# Patient Record
Sex: Male | Born: 1970 | Race: Black or African American | Hispanic: No | Marital: Married | Smoking: Never smoker
Health system: Southern US, Community
[De-identification: ages and names within clinical notes are randomized; demographics above are authoritative.]

## PROBLEM LIST (undated history)

## (undated) DIAGNOSIS — E785 Hyperlipidemia, unspecified: Secondary | ICD-10-CM

## (undated) DIAGNOSIS — E119 Type 2 diabetes mellitus without complications: Secondary | ICD-10-CM

## (undated) DIAGNOSIS — I10 Essential (primary) hypertension: Secondary | ICD-10-CM

## (undated) DIAGNOSIS — G8929 Other chronic pain: Secondary | ICD-10-CM

## (undated) DIAGNOSIS — K219 Gastro-esophageal reflux disease without esophagitis: Secondary | ICD-10-CM

## (undated) DIAGNOSIS — M549 Dorsalgia, unspecified: Secondary | ICD-10-CM

## (undated) HISTORY — DX: Hyperlipidemia, unspecified: E78.5

## (undated) HISTORY — PX: TONSILLECTOMY AND ADENOIDECTOMY: SHX28

## (undated) HISTORY — DX: Dorsalgia, unspecified: M54.9

## (undated) HISTORY — DX: Essential (primary) hypertension: I10

## (undated) HISTORY — DX: Other chronic pain: G89.29

## (undated) HISTORY — DX: Type 2 diabetes mellitus without complications: E11.9

---

## 2001-11-24 ENCOUNTER — Encounter: Payer: Self-pay | Admitting: Family Medicine

## 2001-11-24 ENCOUNTER — Encounter: Admission: RE | Admit: 2001-11-24 | Discharge: 2001-11-24 | Payer: Self-pay | Admitting: Family Medicine

## 2001-11-29 ENCOUNTER — Encounter: Payer: Self-pay | Admitting: Family Medicine

## 2001-11-29 ENCOUNTER — Encounter: Admission: RE | Admit: 2001-11-29 | Discharge: 2001-11-29 | Payer: Self-pay | Admitting: Family Medicine

## 2006-04-06 ENCOUNTER — Emergency Department (HOSPITAL_COMMUNITY): Admission: EM | Admit: 2006-04-06 | Discharge: 2006-04-06 | Payer: Self-pay | Admitting: Family Medicine

## 2006-04-19 ENCOUNTER — Ambulatory Visit: Payer: Self-pay | Admitting: Family Medicine

## 2006-04-19 LAB — CONVERTED CEMR LAB
ALT: 36 units/L (ref 0–40)
AST: 29 units/L (ref 0–37)
BUN: 7 mg/dL (ref 6–23)
CO2: 32 meq/L (ref 19–32)
Calcium: 9.1 mg/dL (ref 8.4–10.5)
Chloride: 105 meq/L (ref 96–112)
Chol/HDL Ratio, serum: 4.8
Cholesterol: 204 mg/dL (ref 0–200)
Creatinine, Ser: 1 mg/dL (ref 0.4–1.5)
GFR calc non Af Amer: 90 mL/min
Glomerular Filtration Rate, Af Am: 109 mL/min/{1.73_m2}
Glucose, Bld: 115 mg/dL — ABNORMAL HIGH (ref 70–99)
HDL: 42.5 mg/dL (ref >39.0)
LDL DIRECT: 141.2 mg/dL
Potassium: 3.4 meq/L — ABNORMAL LOW (ref 3.5–5.1)
Sodium: 142 meq/L (ref 135–145)
Triglyceride fasting, serum: 147 mg/dL (ref 0–149)
VLDL: 29 mg/dL (ref 0–40)

## 2006-05-14 ENCOUNTER — Ambulatory Visit: Payer: Self-pay | Admitting: Family Medicine

## 2006-05-14 LAB — CONVERTED CEMR LAB: Potassium: 3.8 meq/L (ref 3.5–5.1)

## 2006-08-27 ENCOUNTER — Ambulatory Visit: Payer: Self-pay | Admitting: Family Medicine

## 2006-09-17 ENCOUNTER — Ambulatory Visit: Payer: Self-pay | Admitting: Internal Medicine

## 2007-04-12 ENCOUNTER — Ambulatory Visit: Payer: Self-pay | Admitting: Family Medicine

## 2007-04-12 DIAGNOSIS — I10 Essential (primary) hypertension: Secondary | ICD-10-CM | POA: Insufficient documentation

## 2007-04-12 LAB — CONVERTED CEMR LAB
BUN: 9 mg/dL (ref 6–23)
CO2: 29 meq/L (ref 19–32)
Calcium: 9.3 mg/dL (ref 8.4–10.5)
Chloride: 102 meq/L (ref 96–112)
Creatinine, Ser: 1.1 mg/dL (ref 0.4–1.5)
GFR calc Af Amer: 97 mL/min
GFR calc non Af Amer: 81 mL/min
Glucose, Bld: 111 mg/dL — ABNORMAL HIGH (ref 70–99)
Potassium: 3.5 meq/L (ref 3.5–5.1)
Rapid Strep: NEGATIVE
Sodium: 140 meq/L (ref 135–145)

## 2007-04-13 ENCOUNTER — Encounter (INDEPENDENT_AMBULATORY_CARE_PROVIDER_SITE_OTHER): Payer: Self-pay | Admitting: *Deleted

## 2007-04-13 ENCOUNTER — Telehealth (INDEPENDENT_AMBULATORY_CARE_PROVIDER_SITE_OTHER): Payer: Self-pay | Admitting: *Deleted

## 2007-08-29 ENCOUNTER — Telehealth (INDEPENDENT_AMBULATORY_CARE_PROVIDER_SITE_OTHER): Payer: Self-pay | Admitting: *Deleted

## 2007-08-29 ENCOUNTER — Other Ambulatory Visit: Payer: Self-pay

## 2007-08-29 ENCOUNTER — Emergency Department: Payer: Self-pay | Admitting: Emergency Medicine

## 2008-05-03 ENCOUNTER — Ambulatory Visit: Payer: Self-pay | Admitting: Family Medicine

## 2008-05-04 LAB — CONVERTED CEMR LAB

## 2008-05-15 ENCOUNTER — Ambulatory Visit: Payer: Self-pay | Admitting: Family Medicine

## 2008-05-16 LAB — CONVERTED CEMR LAB: Mono Screen: NEGATIVE

## 2009-02-11 ENCOUNTER — Ambulatory Visit: Payer: Self-pay | Admitting: Family Medicine

## 2009-03-04 ENCOUNTER — Telehealth: Payer: Self-pay | Admitting: Family Medicine

## 2009-04-01 ENCOUNTER — Ambulatory Visit: Payer: Self-pay | Admitting: Family Medicine

## 2009-04-01 DIAGNOSIS — I10 Essential (primary) hypertension: Secondary | ICD-10-CM | POA: Insufficient documentation

## 2009-04-01 DIAGNOSIS — K219 Gastro-esophageal reflux disease without esophagitis: Secondary | ICD-10-CM | POA: Insufficient documentation

## 2009-04-01 DIAGNOSIS — M545 Low back pain, unspecified: Secondary | ICD-10-CM | POA: Insufficient documentation

## 2009-04-11 ENCOUNTER — Encounter: Payer: Self-pay | Admitting: Family Medicine

## 2009-04-13 ENCOUNTER — Encounter: Payer: Self-pay | Admitting: Family Medicine

## 2009-05-06 ENCOUNTER — Telehealth: Payer: Self-pay | Admitting: Family Medicine

## 2009-05-15 ENCOUNTER — Ambulatory Visit: Payer: Self-pay | Admitting: Family Medicine

## 2009-05-15 DIAGNOSIS — E119 Type 2 diabetes mellitus without complications: Secondary | ICD-10-CM | POA: Insufficient documentation

## 2009-05-15 DIAGNOSIS — E1165 Type 2 diabetes mellitus with hyperglycemia: Secondary | ICD-10-CM | POA: Insufficient documentation

## 2009-05-15 DIAGNOSIS — E118 Type 2 diabetes mellitus with unspecified complications: Secondary | ICD-10-CM | POA: Insufficient documentation

## 2009-05-16 ENCOUNTER — Ambulatory Visit: Payer: Self-pay | Admitting: Family Medicine

## 2009-05-16 LAB — CONVERTED CEMR LAB
ALT: 20 units/L (ref 0–53)
Albumin: 3.8 g/dL (ref 3.5–5.2)
Basophils Relative: 0.8 % (ref 0.0–3.0)
Bilirubin, Direct: 0.1 mg/dL (ref 0.0–0.3)
CO2: 29 meq/L (ref 19–32)
Chloride: 103 meq/L (ref 96–112)
Eosinophils Absolute: 0.1 10*3/uL (ref 0.0–0.7)
Eosinophils Relative: 1.8 % (ref 0.0–5.0)
HCT: 40.6 % (ref 39.0–52.0)
Hemoglobin: 13.4 g/dL (ref 13.0–17.0)
LDL Cholesterol: 109 mg/dL — ABNORMAL HIGH (ref 0–99)
Lymphs Abs: 2.1 10*3/uL (ref 0.7–4.0)
MCHC: 32.9 g/dL (ref 30.0–36.0)
MCV: 88.3 fL (ref 78.0–100.0)
Monocytes Absolute: 0.4 10*3/uL (ref 0.1–1.0)
Neutro Abs: 2.9 10*3/uL (ref 1.4–7.7)
Neutrophils Relative %: 50.7 % (ref 43.0–77.0)
Potassium: 4 meq/L (ref 3.5–5.1)
RBC: 4.59 M/uL (ref 4.22–5.81)
Sodium: 140 meq/L (ref 135–145)
Total CHOL/HDL Ratio: 4
Total Protein: 7.1 g/dL (ref 6.0–8.3)
Triglycerides: 82 mg/dL (ref 0.0–149.0)
WBC: 5.5 10*3/uL (ref 4.5–10.5)

## 2009-05-20 LAB — CONVERTED CEMR LAB
Calcium: 9.2 mg/dL (ref 8.4–10.5)
Creatinine,U: 156.7 mg/dL
GFR calc non Af Amer: 107.02 mL/min (ref >60)
Glucose, Bld: 298 mg/dL — ABNORMAL HIGH (ref 70–99)
Hgb A1c MFr Bld: 11.6 % — ABNORMAL HIGH (ref 4.6–6.5)
Sodium: 137 meq/L (ref 135–145)

## 2009-05-30 ENCOUNTER — Encounter: Payer: Self-pay | Admitting: Family Medicine

## 2009-06-18 ENCOUNTER — Encounter: Payer: Self-pay | Admitting: Family Medicine

## 2009-06-18 ENCOUNTER — Ambulatory Visit: Payer: Self-pay | Admitting: Family Medicine

## 2009-06-20 ENCOUNTER — Ambulatory Visit: Payer: Self-pay | Admitting: Family Medicine

## 2009-06-21 DIAGNOSIS — E785 Hyperlipidemia, unspecified: Secondary | ICD-10-CM | POA: Insufficient documentation

## 2009-07-24 ENCOUNTER — Telehealth: Payer: Self-pay | Admitting: Family Medicine

## 2009-08-06 ENCOUNTER — Encounter: Payer: Self-pay | Admitting: Family Medicine

## 2009-08-12 ENCOUNTER — Encounter: Payer: Self-pay | Admitting: Family Medicine

## 2009-10-17 ENCOUNTER — Ambulatory Visit: Payer: Self-pay | Admitting: Family Medicine

## 2009-10-18 ENCOUNTER — Encounter (INDEPENDENT_AMBULATORY_CARE_PROVIDER_SITE_OTHER): Payer: Self-pay | Admitting: *Deleted

## 2010-02-24 ENCOUNTER — Ambulatory Visit: Payer: Self-pay | Admitting: Family Medicine

## 2010-06-24 NOTE — Consult Note (Signed)
Summary: [facility]  [facility]   Imported By: Lanelle Bal 06/26/2009 13:46:59  _____________________________________________________________________  External Attachment:    Type:   Image     Comment:   External Document

## 2010-06-24 NOTE — Letter (Signed)
Summary: [facility]   [facility], [facility]   Imported By: Maryln Gottron 08/12/2009 14:00:10  _____________________________________________________________________  External Attachment:    Type:   Image     Comment:   External Document

## 2010-06-24 NOTE — Letter (Signed)
Summary: Letter Regarding Eye Exam/[employer]  Letter Regarding Eye Exam/[employer]   Imported By: Lanelle Bal 08/23/2009 08:44:00  _____________________________________________________________________  External Attachment:    Type:   Image     Comment:   External Document

## 2010-06-24 NOTE — Miscellaneous (Signed)
Summary: PT Discharge/[facility]  PT Discharge/[facility]   Imported By: Lanelle Bal 06/22/2009 10:58:58  _____________________________________________________________________  External Attachment:    Type:   Image     Comment:   External Document

## 2010-06-24 NOTE — Letter (Signed)
Summary: Generic Letter  [facility]  [address] [place], [place] [address]   Phone: [phone]  Fax: [phone]    10/18/2009     Phillip Norman [address] [place], [place]  [address]    Dear Mr. Albus,     Mr. Rabbani, I wanted to send you a follow-up letter. Your a1c average sugar test was 6.6.  You have done amazingly well, and I know you have been working really hard.  6.5 is normal.   This is great.  Take care.         Sincerely,   Hannah Beat MD

## 2010-06-24 NOTE — Assessment & Plan Note (Signed)
Summary: FLU SHOT, PER HEATHER/JRR   Allergies: No Known Drug Allergies   Complete Medication List: 1)  Lisinopril 10 Mg Tabs (Lisinopril) .... Once daily  Other Orders: Flu Vaccine 22yrs + ([address]) Admin 1st Vaccine (60454)   Immunizations Administered:  Influenza Vaccine # 1:    Vaccine Type: Fluvax 3+    Site: left deltoid    Mfr: GlaxoSmithKline    Dose: 0.5 ml    Route: IM    Given by: Benny Lennert CMA (AAMA)    Exp. Date: 11/22/2010    Lot #: [id number]    VIS given: 12/17/09 version given February 24, 2010.  Flu Vaccine Consent Questions:    Do you have a history of severe allergic reactions to this vaccine? no    Any prior history of allergic reactions to egg and/or gelatin? no    Do you have a sensitivity to the preservative Thimersol? no    Do you have a past history of Guillan-Barre Syndrome? no    Do you currently have an acute febrile illness? no    Have you ever had a severe reaction to latex? no    Vaccine information given and explained to patient? yes

## 2010-06-24 NOTE — Assessment & Plan Note (Signed)
Summary: F/U/CLE   Vital Signs:  Patient profile:   40 year old male Height:      74.25 inches Weight:      210.0 pounds BMI:     26.88 Temp:     97.9 degrees F oral Pulse rate:   76 / minute Pulse rhythm:   regular BP sitting:   130 / 92  (left arm) Cuff size:   large  Vitals Entered By: Benny Lennert CMA Duncan Dull) (Oct 17, 2009 12:16 PM)  History of Present Illness: Chief complaint follow up  DM:  discontinued his meds  HTN: not at goal, not taking any medications.  f/u labs needed  low carbs and really has cut back has his treadmill at home has a [employer] cd with a ball exercising for about three to five times a week  has stopped his bs 97-101  110 after supper  this patient has lost almost 30 pounds.  ROS: GEN: No acute illnesses, no fevers, chills, sweats, fatigue, weight loss, or URI sx. GI: No n/v/d Pulm: No SOB, cough, wheezing Interactive and getting along well at home.  Otherwise, ROS is as per the HPI.   GEN: WDWN, NAD, Non-toxic, A & O x 3 HEENT: Atraumatic, Normocephalic. Neck supple. No masses, No LAD. Ears and Nose: No external deformity. CV: RRR, No M/G/R. No JVD. No thrill. No extra heart sounds. PULM: CTA B, no wheezes, crackles, rhonchi. No retractions. No resp. distress. No accessory muscle use. EXTR: No c/c/e NEURO: Normal gait.  PSYCH: Normally interactive. Conversant. Not depressed or anxious appearing.  Calm demeanor.    Allergies (verified): No Known Drug Allergies  Past History:  Past Medical History: Last updated: 06/20/2009 Hypertension Chronic back pain Diabetes mellitus, type II Hyperlipidemia   Impression & Recommendations:  Problem # 1:  DIABETES MELLITUS, TYPE II (ICD-250.00) Assessment Improved this patient is not at goal with regards to his hypertension. He has done dramatically well with his diabetes, and is gone down for blood sugars in the 300s at all x2 now approaching normal  blood sugars all the time.  He is not on any medication. We discussed it, and we are going to leave his  interventions alone at this point. He is done dramatically well with his exercise and diet program.  It is a reasonable goal to consider that his hypertension will improve  as well.  Follow up in 6 months  The following medications were removed from the medication list:    Metformin Hcl 500 Mg Xr24h-tab (Metformin hcl) .Marland Kitchen... 2 by mouth daily His updated medication list for this problem includes:    Lisinopril 10 Mg Tabs (Lisinopril) ..... Once daily  Orders: Venipuncture (45409) TLB-A1C / Hgb A1C (Glycohemoglobin) (83036-A1C)  Problem # 2:  HYPERTENSION (ICD-401.9)  His updated medication list for this problem includes:    Lisinopril 10 Mg Tabs (Lisinopril) ..... Once daily  Problem # 3:  HYPERLIPIDEMIA (ICD-272.4)  Complete Medication List: 1)  Lisinopril 10 Mg Tabs (Lisinopril) .... Once daily  Patient Instructions: 1)  f/u 6 months  Current Allergies (reviewed today): No known allergies

## 2010-06-24 NOTE — Assessment & Plan Note (Signed)
Summary: 1 MONTH FOLLOW UP/[facility]   Vital Signs:  Patient profile:   40 year old male Height:      74.25 inches Weight:      228.6 pounds BMI:     29.26 Temp:     98.3 degrees F oral Pulse rate:   76 / minute Pulse rhythm:   regular BP sitting:   140 / 100  (left arm) Cuff size:   large  Vitals Entered By: Benny Lennert CMA Duncan Dull) (June 20, 2009 12:13 PM)  History of Present Illness: Chief complaint 1 month follow up  40 year old male:  flu shot  pneumonia vaccine eye doctor - 04/2010  1. DM: started metformin, has been working out really hard, lost about 5 pounds, firming up, too WEnt to DM education, saw dietitician  2. HTN: not at goal, forgot medicine again today  3. Lipids: not at goal, not ready to start medicine  ROS: GEN: No acute illnesses, no fevers, chills, sweats, fatigue, weight loss, or URI sx. GI: No n/v/d Pulm: No SOB, cough, wheezing Interactive and getting along well at home.  Otherwise, ROS is as per the HPI.   GEN: WDWN, NAD, Non-toxic, A & O x 3 HEENT: Atraumatic, Normocephalic. Neck supple. No masses, No LAD. Ears and Nose: No external deformity. CV: RRR, No M/G/R. No JVD. No thrill. No extra heart sounds. PULM: CTA B, no wheezes, crackles, rhonchi. No retractions. No resp. distress. No accessory muscle use. EXTR: No c/c/e NEURO: Normal gait.  PSYCH: Normally interactive. Conversant. Not depressed or anxious appearing.  Calm demeanor.    Current Problems (verified): 1)  Hyperlipidemia  (ICD-272.4) 2)  Diabetes Mellitus, Type II  (ICD-250.00) 3)  Health Maintenance Exam  (ICD-V70.0) 4)  Gerd  (ICD-530.81) 5)  Hypertension  (ICD-401.9) 6)  Back Pain, Lumbar, Chronic  (ICD-724.2) 7)  Hypertension, Benign Essential  (ICD-401.1)  Allergies (verified): No Known Drug Allergies  Past History:  Past medical, surgical, family and social histories (including risk factors) reviewed, and no changes noted (except as noted below).  Past  Medical History: Hypertension Chronic back pain Diabetes mellitus, type II Hyperlipidemia  Past Surgical History: Reviewed history from 05/03/2008 and no changes required. Tonsillectomy/Adenoids, 1977  Family History: Reviewed history from 05/03/2008 and no changes required. Family History Diabetes 1st degree relative Family History Hypertension  Social History: Reviewed history from 05/03/2008 and no changes required. 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 1 by mouth daily for 2 weeks, then increase to 2 by mouth daily His updated medication list for this problem includes:    Lisinopril 10 Mg Tabs (Lisinopril) ..... Once daily    Metformin Hcl 500 Mg Xr24h-tab (Metformin hcl) .Marland Kitchen... 2 by mouth daily  Problem # 2:  HYPERTENSION (ICD-401.9) compliance emphasized  His updated medication list for this problem includes:    Lisinopril 10 Mg Tabs (Lisinopril) ..... Once daily  Problem # 3:  HYPERLIPIDEMIA (ICD-272.4) TLC  Labs Reviewed: SGOT: 18 (05/15/2009)   SGPT: 20 (05/15/2009)   HDL:37.30 (05/15/2009), 42.5 (04/19/2006)  LDL:109 (05/15/2009), DEL (04/19/2006)  Chol:163 (05/15/2009), 204 (04/19/2006)  Trig:82.0 (05/15/2009), 147 (04/19/2006)  Complete Medication List: 1)  Lisinopril 10 Mg Tabs (Lisinopril) .... Once  daily 2)  Metformin Hcl 500 Mg Xr24h-tab (Metformin hcl) .... 2 by mouth daily 3)  Accucheck Machine  .... Generic, dx 250.00 4)  Accucheck Lancets  .... Generic, check bs two times a day (250.00) 5)  Accucheck Strips  .... Generic, check bs two times a day (250.00)  Other Orders: Pneumococcal Vaccine (04540) Admin 1st  Vaccine (98119)  Patient Instructions: 1)  f/u 3 months Prescriptions: METFORMIN HCL 500 MG XR24H-TAB (METFORMIN HCL) 2 by mouth daily  #60 x 5   Entered and Authorized by:   Hannah Beat MD   Signed by:   Hannah Beat MD on 06/20/2009   Method used:   Electronically to        [facility] [address] (retail)       [address]       [place], [place]  [address]       Ph: [phone]       Fax: [phone]   RxID:   [id number]   Current Allergies (reviewed today): No known allergies    Immunizations Administered:  Pneumonia Vaccine:    Vaccine Type: Pneumovax    Site: left deltoid    Mfr: Merck    Dose: 0.5 ml    Route: IM    Given by: Benny Lennert CMA (AAMA)    Exp. Date: 06/20/2010    Lot #: [id number]    Prevention & Chronic Care Immunizations   Influenza vaccine: given  (05/15/2009)   Influenza vaccine due: 05/15/2010    Tetanus booster: Not documented    Pneumococcal vaccine: Pneumovax  (06/20/2009)  Other Screening   Smoking status: never  (05/15/2009)  Diabetes Mellitus   HgbA1C: 11.6  (05/16/2009)    Eye exam: Not documented    Foot exam: Not documented   High risk foot: Not documented   Foot care education: Not documented    Urine microalbumin/creatinine ratio: 4.5  (05/16/2009)    Diabetes flowsheet reviewed?: Yes   Progress toward A1C goal: Improved  Lipids   Total Cholesterol: 163  (05/15/2009)   LDL: 109  (05/15/2009)   LDL Direct: Not documented   HDL: 37.30  (05/15/2009)   Triglycerides: 82.0  (05/15/2009)    SGOT (AST): 18  (05/15/2009)   SGPT (ALT): 20  (05/15/2009)   Alkaline phosphatase: 75  (05/15/2009)   Total bilirubin: 0.7  (05/15/2009)  Hypertension   Last Blood Pressure: 140 / 100  (06/20/2009)   Serum creatinine: 1.0  (05/16/2009)   Serum potassium 3.9  (05/16/2009)   Progress toward BP goal: Unchanged

## 2010-06-24 NOTE — Progress Notes (Signed)
Summary: lisinopril   Phone Note Refill Request Message from:  Patient on July 24, 2009 3:05 PM  Refills Requested: Medication #1:  LISINOPRIL 10 MG TABS once daily [facility] [address]   Initial call taken by: Melody Comas,  July 24, 2009 3:05 PM    Prescriptions: LISINOPRIL 10 MG TABS (LISINOPRIL) once daily  #30 x 6   Entered by:   Benny Lennert CMA (AAMA)   Authorized by:   Kerby Nora MD   Signed by:   Benny Lennert CMA (AAMA) on 07/24/2009   Method used:   Electronically to        [facility] [address] (retail)       [address], [address]       [place], [place]  [address]       Ph: [phone]       Fax: [phone]   RxID:   [id number]

## 2012-06-02 ENCOUNTER — Encounter: Payer: Self-pay | Admitting: Family Medicine

## 2012-06-02 ENCOUNTER — Ambulatory Visit (INDEPENDENT_AMBULATORY_CARE_PROVIDER_SITE_OTHER): Payer: Private Health Insurance - Indemnity | Admitting: Family Medicine

## 2012-06-02 VITALS — BP 140/96 | HR 75 | Temp 98.1°F | Ht 74.25 in | Wt 237.8 lb

## 2012-06-02 DIAGNOSIS — I10 Essential (primary) hypertension: Secondary | ICD-10-CM

## 2012-06-02 DIAGNOSIS — Z23 Encounter for immunization: Secondary | ICD-10-CM

## 2012-06-02 DIAGNOSIS — E785 Hyperlipidemia, unspecified: Secondary | ICD-10-CM

## 2012-06-02 DIAGNOSIS — E119 Type 2 diabetes mellitus without complications: Secondary | ICD-10-CM

## 2012-06-02 DIAGNOSIS — Z0000 Encounter for general adult medical examination without abnormal findings: Secondary | ICD-10-CM

## 2012-06-02 DIAGNOSIS — Z79899 Other long term (current) drug therapy: Secondary | ICD-10-CM

## 2012-06-02 LAB — CBC WITH DIFFERENTIAL/PLATELET
Basophils Relative: 0.3 % (ref 0.0–3.0)
Eosinophils Relative: 0.4 % (ref 0.0–5.0)
HCT: 40.6 % (ref 39.0–52.0)
Lymphs Abs: 2.5 10*3/uL (ref 0.7–4.0)
MCV: 85.8 fl (ref 78.0–100.0)
Monocytes Absolute: 0.3 10*3/uL (ref 0.1–1.0)
Monocytes Relative: 4.6 % (ref 3.0–12.0)
Neutrophils Relative %: 57.9 % (ref 43.0–77.0)
RBC: 4.74 Mil/uL (ref 4.22–5.81)
WBC: 6.8 10*3/uL (ref 4.5–10.5)

## 2012-06-02 LAB — BASIC METABOLIC PANEL
Chloride: 100 mEq/L (ref 96–112)
GFR: 96.44 mL/min (ref >60.00)
Glucose, Bld: 200 mg/dL — ABNORMAL HIGH (ref 70–99)
Potassium: 3.8 mEq/L (ref 3.5–5.1)
Sodium: 136 mEq/L (ref 135–145)

## 2012-06-02 LAB — MICROALBUMIN / CREATININE URINE RATIO
Creatinine,U: 332 mg/dL
Microalb, Ur: 1.8 mg/dL (ref 0.0–1.9)

## 2012-06-02 LAB — LIPID PANEL
LDL Cholesterol: 123 mg/dL — ABNORMAL HIGH (ref 0–99)
Total CHOL/HDL Ratio: 5
Triglycerides: 120 mg/dL (ref 0.0–149.0)
VLDL: 24 mg/dL (ref 0.0–40.0)

## 2012-06-02 MED ORDER — SILDENAFIL CITRATE 100 MG PO TABS: 100.0000 mg | ORAL_TABLET | Freq: Every day | ORAL | Status: DC | PRN

## 2012-06-02 MED ORDER — LISINOPRIL 20 MG PO TABS: 20.0000 mg | ORAL_TABLET | Freq: Every day | ORAL | Status: DC

## 2012-06-02 MED ORDER — ESOMEPRAZOLE MAGNESIUM 40 MG PO CPDR: 40.0000 mg | DELAYED_RELEASE_CAPSULE | Freq: Every day | ORAL | Status: DC

## 2012-06-02 NOTE — Progress Notes (Signed)
Nature conservation officer at [facility] [address] [place] [address] Phone: [phone] Fax: [phone]  Date:  06/02/2012   Name:  Phillip Norman   DOB:  05-30-1970   MRN:  [id number] Gender: male Age: 42 y.o.  PCP:  Hannah Beat, MD  Evaluating MD: Hannah Beat, MD   Chief Complaint: Annual Exam   History of Present Illness:  Phillip Norman is a 42 y.o. pleasant patient who presents with the following:  Wt Readings from Last 3 Encounters:  06/02/12 237 lb 12 oz (107.843 kg)  10/17/09 210 lb (95.255 kg)  06/20/09 228 lb 9.6 oz (103.692 kg)   Moved back to this area, and got down to 200.   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  Tobacco History Reviewed. Alcohol: No concerns, no excessive use Exercise Habits: Some activity now - less than he was doing. STD concerns: no risk or activity to increase risk Drug Use: None Encouraged self-testicular check  Health Maintenance  Topic Date Due  . Tetanus/tdap  06/07/1989  . Influenza Vaccine  01/23/2013   Off all DM meds  HTN: Tolerating all medications without side effects Elevated BP now No CP, no sob. No HA.  BP Readings from Last 3 Encounters:  06/02/12 140/96  10/17/09 130/92  06/20/09 140/100    Patient Active Problem List  Diagnosis  . DIABETES MELLITUS, TYPE II  . HYPERLIPIDEMIA  . HYPERTENSION, BENIGN ESSENTIAL  . HYPERTENSION  . GERD  . BACK PAIN, LUMBAR, CHRONIC    Past Medical History  Diagnosis Date  . Hypertension   . Chronic back pain   . Diabetes mellitus without complication   . Hyperlipidemia     Past Surgical History  Procedure Date  . Tonsillectomy and adenoidectomy     History  Substance Use Topics  . Smoking status: Never Smoker   . Smokeless tobacco: Not on file  . Alcohol Use: No    No family  history on file.  No Known Allergies  Medication list has been reviewed and updated.  No outpatient prescriptions prior to visit.    Last reviewed on 06/02/2012  2:42 PM by Consuello Masse, CMA  Review of Systems:   General: Denies fever, chills, sweats. No significant weight loss. + 25 pound weight gain Eyes: Denies blurring,significant itching ENT: Denies earache, sore throat, and hoarseness. Cardiovascular: Denies chest pains, palpitations, dyspnea on exertion Respiratory: Denies cough, dyspnea at rest,wheeezing Breast: no concerns about lumps GI: Denies nausea, vomiting, diarrhea, constipation, change in bowel habits, abdominal pain, melena, hematochezia GU: Denies penile discharge, ED, urinary flow / outflow problems. No STD concerns. Musculoskeletal: Denies back pain, joint pain Derm: Denies rash, itching Neuro: Denies  paresthesias, frequent falls, frequent headaches Psych: Denies depression, anxiety Endocrine: Denies cold intolerance, heat intolerance, polydipsia Heme: Denies enlarged lymph nodes Allergy: No hayfever   Physical Examination: BP 140/96  Pulse 75  Temp 98.1 F (36.7 C) (Oral)  Ht 6' 2.25" (1.886 m)  Wt 237 lb 12 oz (107.843 kg)  BMI 30.32 kg/m2  SpO2 99%  Ideal Body Weight: Weight in (lb) to have BMI = 25: 195.6   GEN: well developed, well nourished, no acute distress Eyes: conjunctiva and lids normal, PERRLA, EOMI ENT: TM clear, nares clear, oral exam WNL Neck: supple, no lymphadenopathy, no thyromegaly, no JVD Pulm: clear to auscultation  and percussion, respiratory effort normal CV: regular rate and rhythm, S1-S2, no murmur, rub or gallop, no bruits, peripheral pulses normal and symmetric, no cyanosis, clubbing, edema or varicosities GI: soft, non-tender; no hepatosplenomegaly, masses; active bowel sounds all quadrants GU: no hernia, testicular mass, penile discharge Lymph: no cervical, axillary or inguinal adenopathy MSK: gait normal,  muscle tone and strength WNL, no joint swelling, effusions, discoloration, crepitus  SKIN: clear, good turgor, color WNL, no rashes, lesions, or ulcerations Neuro: normal mental status, normal strength, sensation, and motion Psych: alert; oriented to person, place and time, normally interactive and not anxious or depressed in appearance.   Assessment and Plan:  1. Routine general medical examination at a health care facility    2. DIABETES MELLITUS, TYPE II  Hemoglobin A1c, Microalbumin / creatinine urine ratio  3. HYPERTENSION    4. HYPERLIPIDEMIA  Lipid panel  5. Encounter for long-term (current) use of other medications  Basic metabolic panel, CBC with Differential  6. Need for prophylactic vaccination and inoculation against influenza  Flu vaccine greater than or equal to 3yo preservative free IM   The patient's preventative maintenance and recommended screening tests for an annual wellness exam were reviewed in full today. Brought up to date unless services declined.  Counselled on the importance of diet, exercise, and its role in overall health and mortality. The patient's FH and SH was reviewed, including their home life, tobacco status, and drug and alcohol status.   Take stock of other chronic probs - labs  Orders Today:  Orders Placed This Encounter  Procedures  . Flu vaccine greater than or equal to 3yo preservative free IM  . Hemoglobin A1c  . Microalbumin / creatinine urine ratio  . Basic metabolic panel  . CBC with Differential  . Lipid panel    Updated Medication List: (Includes new medications, updates to list, dose adjustments) Meds ordered this encounter  Medications  . DISCONTD: lisinopril (PRINIVIL,ZESTRIL) 10 MG tablet    Sig: Take 10 mg by mouth daily.  Marland Kitchen lisinopril (PRINIVIL,ZESTRIL) 20 MG tablet    Sig: Take 1 tablet (20 mg total) by mouth daily.    Dispense:  30 tablet    Refill:  11  . sildenafil (VIAGRA) 100 MG tablet    Sig: Take 1 tablet (100  mg total) by mouth daily as needed for erectile dysfunction.    Dispense:  10 tablet    Refill:  11  . esomeprazole (NEXIUM) 40 MG capsule    Sig: Take 1 capsule (40 mg total) by mouth daily.    Dispense:  30 capsule    Refill:  11    Medications Discontinued: Medications Discontinued During This Encounter  Medication Reason  . lisinopril (PRINIVIL,ZESTRIL) 10 MG tablet Reorder     Hannah Beat, MD

## 2012-06-08 MED ORDER — METFORMIN HCL ER 500 MG PO TB24: 1500.0000 mg | ORAL_TABLET | Freq: Every day | ORAL | Status: DC

## 2012-06-08 NOTE — Addendum Note (Signed)
Addended by: Consuello Masse on: 06/08/2012 10:33 AM   Modules accepted: Orders

## 2012-06-09 ENCOUNTER — Telehealth: Payer: Self-pay

## 2012-06-09 NOTE — Telephone Encounter (Signed)
ok 

## 2012-06-09 NOTE — Telephone Encounter (Signed)
[facility] faxed prior auth request for nexium 40 mg; form is on Dr [employer].

## 2012-07-25 ENCOUNTER — Other Ambulatory Visit: Payer: Self-pay

## 2012-07-25 MED ORDER — LISINOPRIL 20 MG PO TABS: 20.0000 mg | ORAL_TABLET | Freq: Every day | ORAL | Status: DC

## 2012-07-25 NOTE — Telephone Encounter (Signed)
[facility] faxed refill 90 day rx for lisinopril # 90 x 3.

## 2012-08-01 ENCOUNTER — Ambulatory Visit: Payer: Private Health Insurance - Indemnity | Admitting: Family Medicine

## 2012-08-10 ENCOUNTER — Other Ambulatory Visit: Payer: Self-pay | Admitting: *Deleted

## 2012-08-10 MED ORDER — METFORMIN HCL ER 500 MG PO TB24: 1500.0000 mg | ORAL_TABLET | Freq: Every day | ORAL | Status: DC

## 2012-09-16 ENCOUNTER — Ambulatory Visit (INDEPENDENT_AMBULATORY_CARE_PROVIDER_SITE_OTHER): Payer: Private Health Insurance - Indemnity | Admitting: Family Medicine

## 2012-09-16 ENCOUNTER — Encounter: Payer: Self-pay | Admitting: Family Medicine

## 2012-09-16 VITALS — BP 130/70 | HR 74 | Temp 98.7°F | Ht 74.25 in | Wt 230.5 lb

## 2012-09-16 DIAGNOSIS — M545 Low back pain, unspecified: Secondary | ICD-10-CM

## 2012-09-16 MED ORDER — DICLOFENAC SODIUM 75 MG PO TBEC: 75.0000 mg | DELAYED_RELEASE_TABLET | Freq: Two times a day (BID) | ORAL | Status: DC

## 2012-09-16 MED ORDER — CYCLOBENZAPRINE HCL 10 MG PO TABS: 10.0000 mg | ORAL_TABLET | Freq: Every evening | ORAL | Status: DC | PRN

## 2012-09-16 NOTE — Assessment & Plan Note (Signed)
Most likely MSK strain.. Treat with NSAIDs, muscle relaxant., heat and stretches. Follow up if not improving in 2 weeks.

## 2012-09-16 NOTE — Patient Instructions (Addendum)
Treat with diclofenac twice daily, muscle relaxant at night., heat and stretches. Follow up if not improving in 2 weeks.   No heavy lifting, no repetitive twisting.

## 2012-09-16 NOTE — Progress Notes (Signed)
  Subjective:    Patient ID: Phillip Norman, male    DOB: Apr 21, 1971, 42 y.o.   MRN: [id number]  HPI  42 year old male with DM,HTN  and history chronic low back pain presents with acute back pain increase that started 2 weeks ago after bending over cutting grass. Severe pain for few days... Now daily pain 7-9/10 on pain scale.  Pain is right lower back, no radiation to right leg.  No weakness , no numbness in leg.  No fever. No perineal pain or incontinence.  Pain increases with walking and standing. Lying on stomach hurts a lot. Pain keeping him up at night.. Uncomfortable.. Tossing and turning.  Advil 800 mg every 8 hours prn.  Heat/ice alternating this weekend.  Review of Systems  Constitutional: Negative for fever and fatigue.  HENT: Negative for congestion.   Eyes: Negative for pain.  Respiratory: Negative for shortness of breath and wheezing.   Cardiovascular: Negative for chest pain, palpitations and leg swelling.  Gastrointestinal: Negative for abdominal pain.       Objective:   Physical Exam  Constitutional: He appears well-developed and well-nourished.  Eyes: Conjunctivae are normal. Pupils are equal, round, and reactive to light.  Neck: Normal range of motion. Neck supple. No thyromegaly present.  Cardiovascular: Normal rate, regular rhythm and normal heart sounds.   Pulmonary/Chest: Effort normal and breath sounds normal.  Abdominal: Soft. Bowel sounds are normal.  Musculoskeletal:       Thoracic back: He exhibits decreased range of motion and tenderness. He exhibits no bony tenderness and no deformity.       Lumbar back: He exhibits decreased range of motion and tenderness. He exhibits no bony tenderness and no deformity.  ttp over paraspinous muscles  neg SLR, neg Faber's          Assessment & Plan:

## 2012-09-21 ENCOUNTER — Ambulatory Visit: Payer: Private Health Insurance - Indemnity | Admitting: Family Medicine

## 2013-09-18 ENCOUNTER — Other Ambulatory Visit: Payer: Self-pay | Admitting: Family Medicine

## 2013-11-02 ENCOUNTER — Emergency Department (HOSPITAL_COMMUNITY)
Admission: EM | Admit: 2013-11-02 | Discharge: 2013-11-02 | Disposition: A | Discharge status: Home / Self Care | Payer: 59 | Attending: Emergency Medicine | Admitting: Emergency Medicine

## 2013-11-02 ENCOUNTER — Emergency Department (HOSPITAL_COMMUNITY): Payer: 59

## 2013-11-02 ENCOUNTER — Encounter (HOSPITAL_COMMUNITY): Payer: Self-pay | Admitting: Emergency Medicine

## 2013-11-02 DIAGNOSIS — Z791 Long term (current) use of non-steroidal anti-inflammatories (NSAID): Secondary | ICD-10-CM | POA: Insufficient documentation

## 2013-11-02 DIAGNOSIS — R079 Chest pain, unspecified: Secondary | ICD-10-CM | POA: Insufficient documentation

## 2013-11-02 DIAGNOSIS — E119 Type 2 diabetes mellitus without complications: Secondary | ICD-10-CM | POA: Insufficient documentation

## 2013-11-02 DIAGNOSIS — I10 Essential (primary) hypertension: Secondary | ICD-10-CM | POA: Insufficient documentation

## 2013-11-02 DIAGNOSIS — G8929 Other chronic pain: Secondary | ICD-10-CM | POA: Insufficient documentation

## 2013-11-02 DIAGNOSIS — Z79899 Other long term (current) drug therapy: Secondary | ICD-10-CM | POA: Insufficient documentation

## 2013-11-02 DIAGNOSIS — R739 Hyperglycemia, unspecified: Secondary | ICD-10-CM

## 2013-11-02 LAB — CBC
HEMATOCRIT: 41.2 % (ref 39.0–52.0)
Hemoglobin: 13.5 g/dL (ref 13.0–17.0)
MCH: 27.8 pg (ref 26.0–34.0)
MCHC: 32.8 g/dL (ref 30.0–36.0)
MCV: 84.8 fL (ref 78.0–100.0)
Platelets: 219 10*3/uL (ref 150–400)
RBC: 4.86 MIL/uL (ref 4.22–5.81)
RDW: 13.4 % (ref 11.5–15.5)
WBC: 6.1 10*3/uL (ref 4.0–10.5)

## 2013-11-02 LAB — BASIC METABOLIC PANEL
BUN: 12 mg/dL (ref 6–23)
CHLORIDE: 100 meq/L (ref 96–112)
CO2: 28 mEq/L (ref 19–32)
Calcium: 9.4 mg/dL (ref 8.4–10.5)
Creatinine, Ser: 1.2 mg/dL (ref 0.50–1.35)
GFR calc non Af Amer: 73 mL/min — ABNORMAL LOW (ref >90)
GFR, EST AFRICAN AMERICAN: 84 mL/min — AB (ref >90)
Glucose, Bld: 258 mg/dL — ABNORMAL HIGH (ref 70–99)
POTASSIUM: 4.9 meq/L (ref 3.7–5.3)
Sodium: 140 mEq/L (ref 137–147)

## 2013-11-02 LAB — CBG MONITORING, ED: GLUCOSE-CAPILLARY: 251 mg/dL — AB (ref 70–99)

## 2013-11-02 LAB — I-STAT TROPONIN, ED: Troponin i, poc: 0 ng/mL (ref 0.00–0.08)

## 2013-11-02 MED ORDER — ASPIRIN 325 MG PO TABS
325.0000 mg | ORAL_TABLET | ORAL | Status: AC
  Administered 2013-11-02: 325 mg via ORAL
  Filled 2013-11-02: qty 1

## 2013-11-02 MED ORDER — METFORMIN HCL 500 MG PO TABS: 500.0000 mg | ORAL_TABLET | Freq: Two times a day (BID) | ORAL | Status: DC

## 2013-11-02 NOTE — ED Notes (Signed)
md at bedside

## 2013-11-02 NOTE — ED Provider Notes (Signed)
CSN: [id number]     Arrival date & time 11/02/13  0016 History   First MD Initiated Contact with Patient 11/02/13 0419     Chief Complaint  Patient presents with  . Hyperglycemia  . Chest Pain     (Consider location/radiation/quality/duration/timing/severity/associated sxs/prior Treatment) HPI Comments: Patient is a 43 year old male with history of Type 2 DM.  He has been off his metformin for the past two years after losing weight and making lifestyle changes.  He presents tonight with elevated sugars at home and complaints of chest discomfort that has been occurring intermittently for the past month.  No sob, nausea, diaphoresis, or radiation to the arm or jaw.  No exertional symptoms.  He reports that he has an extremely stressful job and believes this is contributing to both of his issues listed above.  Patient is a 43 y.o. male presenting with hyperglycemia and chest pain. The history is provided by the patient.  Hyperglycemia Blood sugar level PTA:  300 Severity:  Moderate Onset quality:  Gradual Duration:  1 month Timing:  Intermittent Progression:  Worsening Diabetes status:  Controlled with diet Associated symptoms: chest pain   Chest Pain   Past Medical History  Diagnosis Date  . Hypertension   . Chronic back pain   . Diabetes mellitus without complication   . Hyperlipidemia    Past Surgical History  Procedure Laterality Date  . Tonsillectomy and adenoidectomy     No family history on file. History  Substance Use Topics  . Smoking status: Never Smoker   . Smokeless tobacco: Not on file  . Alcohol Use: No    Review of Systems  Cardiovascular: Positive for chest pain.  All other systems reviewed and are negative.     Allergies  Review of patient's allergies indicates no known allergies.  Home Medications   Prior to Admission medications   Medication Sig Start Date End Date Taking? Authorizing Provider  cyclobenzaprine (FLEXERIL) 10 MG tablet Take 1  tablet (10 mg total) by mouth at bedtime as needed for muscle spasms. 09/16/12   Amy Cletis [place], MD  diclofenac (VOLTAREN) 75 MG EC tablet Take 1 tablet (75 mg total) by mouth 2 (two) times daily. 09/16/12   Amy Cletis [place], MD  esomeprazole (NEXIUM) 40 MG capsule Take 1 capsule (40 mg total) by mouth daily. 06/02/12   Spencer Copland, MD  lisinopril (PRINIVIL,ZESTRIL) 20 MG tablet TAKE 1 TABLET BY MOUTH EVERY DAY 09/18/13   Owens Loffler, MD  metFORMIN (GLUCOPHAGE-XR) 500 MG 24 hr tablet Take 3 tablets (1,500 mg total) by mouth daily with breakfast. 08/10/12   Owens Loffler, MD  sildenafil (VIAGRA) 100 MG tablet Take 1 tablet (100 mg total) by mouth daily as needed for erectile dysfunction. 06/02/12   Spencer Copland, MD   BP 143/87  Pulse 70  Temp(Src) 97.9 F (36.6 C)  Resp 19  SpO2 100% Physical Exam  Nursing note and vitals reviewed. Constitutional: He is oriented to person, place, and time. He appears well-developed and well-nourished. No distress.  HENT:  Head: Normocephalic and atraumatic.  Mouth/Throat: Oropharynx is clear and moist.  Neck: Normal range of motion. Neck supple.  Cardiovascular: Normal rate, regular rhythm and normal heart sounds.   No murmur heard. Pulmonary/Chest: Effort normal and breath sounds normal. No respiratory distress. He has no wheezes.  Abdominal: Soft. Bowel sounds are normal. He exhibits no distension. There is no tenderness.  Musculoskeletal: Normal range of motion. He exhibits no edema.  Neurological:  He is alert and oriented to person, place, and time.  Skin: Skin is warm and dry. He is not diaphoretic.    ED Course  Procedures (including critical care time) Labs Review Labs Reviewed  BASIC METABOLIC PANEL - Abnormal; Notable for the following:    Glucose, Bld 258 (*)    GFR calc non Af Amer 73 (*)    GFR calc Af Amer 84 (*)    All other components within normal limits  CBG MONITORING, ED - Abnormal; Notable for the following:     Glucose-Capillary 251 (*)    All other components within normal limits  CBC  I-STAT TROPOININ, ED    Imaging Review Dg Chest 2 View  11/02/2013   CLINICAL DATA:  Intermittent chest tightness.  EXAM: CHEST  2 VIEW  COMPARISON:  Chest radiograph April 06, 2006.  FINDINGS: Cardiomediastinal silhouette is unremarkable. The lungs are clear without pleural effusions or focal consolidations. Trachea projects midline and there is no pneumothorax. Soft tissue planes and included osseous structures are non-suspicious.  IMPRESSION: No acute cardiopulmonary process.   Electronically Signed   By: Elon Alas   On: 11/02/2013 03:46     EKG Interpretation   Date/Time:  Thursday November 02 2013 00:58:41 EDT Ventricular Rate:  81 PR Interval:  164 QRS Duration: 80 QT Interval:  344 QTC Calculation: 399 R Axis:   4 Text Interpretation:  Normal sinus rhythm Cannot rule out Anterior infarct  , age undetermined Abnormal ECG Confirmed by DELOS  MD, Ghadeer Kastelic ([id number]) on  11/02/2013 4:22:31 AM      MDM   Final diagnoses:  None    Patient presents here with elevated blood sugars and discomfort in his chest for the past month. He was told by Dr. to come here to be evaluated. His sugar tonight is 258 and troponin is negative and EKG is unchanged from 2007. Nothing tonight appears acute. I will restart the patient's metformin and have him follow his blood sugars. He states that he will try to make some lifestyle changes, including exercise and dietary modification see if he can get his blood sugars down further. I will also advise him to call cardiology to potentially schedule a stress test due to the discomfort he has been experiencing in his chest. I am doubtful this is cardiac however due the fact he is diabetic tablet important for him to have this test performed. He understands to return if his symptoms worsen or change.    Veryl Speak, MD 11/02/13 [phone]

## 2013-11-02 NOTE — Discharge Instructions (Signed)
Metformin as prescribed.  Keep a record of your blood sugars to take with you to your next doctor's appointment.  Call cardiology to arrange a followup appointment to discuss a stress test. The contact information for [facility] has been provided in this discharge summary.   Return to the ER if you develop worsening symptoms.   Hyperglycemia Hyperglycemia occurs when the glucose (sugar) in your blood is too high. Hyperglycemia can happen for many reasons, but it most often happens to people who do not know they have diabetes or are not managing their diabetes properly.  CAUSES  Whether you have diabetes or not, there are other causes of hyperglycemia. Hyperglycemia can occur when you have diabetes, but it can also occur in other situations that you might not be as aware of, such as: Diabetes  If you have diabetes and are having problems controlling your blood glucose, hyperglycemia could occur because of some of the following reasons:  Not following your meal plan.  Not taking your diabetes medications or not taking it properly.  Exercising less or doing less activity than you normally do.  Being sick. Pre-diabetes  This cannot be ignored. Before people develop Type 2 diabetes, they almost always have "pre-diabetes." This is when your blood glucose levels are higher than normal, but not yet high enough to be diagnosed as diabetes. Research has shown that some long-term damage to the body, especially the heart and circulatory system, may already be occurring during pre-diabetes. If you take action to manage your blood glucose when you have pre-diabetes, you may delay or prevent Type 2 diabetes from developing. Stress  If you have diabetes, you may be "diet" controlled or on oral medications or insulin to control your diabetes. However, you may find that your blood glucose is higher than usual in the hospital whether you have diabetes or not. This is often referred to as "stress  hyperglycemia." Stress can elevate your blood glucose. This happens because of hormones put out by the body during times of stress. If stress has been the cause of your high blood glucose, it can be followed regularly by your caregiver. That way he/she can make sure your hyperglycemia does not continue to get worse or progress to diabetes. Steroids  Steroids are medications that act on the infection fighting system (immune system) to block inflammation or infection. One side effect can be a rise in blood glucose. Most people can produce enough extra insulin to allow for this rise, but for those who cannot, steroids make blood glucose levels go even higher. It is not unusual for steroid treatments to "uncover" diabetes that is developing. It is not always possible to determine if the hyperglycemia will go away after the steroids are stopped. A special blood test called an A1c is sometimes done to determine if your blood glucose was elevated before the steroids were started. SYMPTOMS  Thirsty.  Frequent urination.  Dry mouth.  Blurred vision.  Tired or fatigue.  Weakness.  Sleepy.  Tingling in feet or leg. DIAGNOSIS  Diagnosis is made by monitoring blood glucose in one or all of the following ways:  A1c test. This is a chemical found in your blood.  Fingerstick blood glucose monitoring.  Laboratory results. TREATMENT  First, knowing the cause of the hyperglycemia is important before the hyperglycemia can be treated. Treatment may include, but is not be limited to:  Education.  Change or adjustment in medications.  Change or adjustment in meal plan.  Treatment for an illness,  infection, etc.  More frequent blood glucose monitoring.  Change in exercise plan.  Decreasing or stopping steroids.  Lifestyle changes. HOME CARE INSTRUCTIONS   Test your blood glucose as directed.  Exercise regularly. Your caregiver will give you instructions about exercise. Pre-diabetes or  diabetes which comes on with stress is helped by exercising.  Eat wholesome, balanced meals. Eat often and at regular, fixed times. Your caregiver or nutritionist will give you a meal plan to guide your sugar intake.  Being at an ideal weight is important. If needed, losing as little as 10 to 15 pounds may help improve blood glucose levels. SEEK MEDICAL CARE IF:   You have questions about medicine, activity, or diet.  You continue to have symptoms (problems such as increased thirst, urination, or weight gain). SEEK IMMEDIATE MEDICAL CARE IF:   You are vomiting or have diarrhea.  Your breath smells fruity.  You are breathing faster or slower.  You are very sleepy or incoherent.  You have numbness, tingling, or pain in your feet or hands.  You have chest pain.  Your symptoms get worse even though you have been following your caregiver's orders.  If you have any other questions or concerns. Document Released: 11/04/2000 Document Revised: 08/03/2011 Document Reviewed: 09/07/2011 [facility] Patient Information 2014 [place], [place].

## 2013-11-02 NOTE — ED Notes (Signed)
Pt is now reporting mild SOB, patient is still able to speak in complete sentences. NAD noted.

## 2013-11-02 NOTE — ED Notes (Signed)
Per Patient: pt report he has been having intermittent chest tightness for several weeks. Denies SOB, n/v. Tightness is felt most over the left breast, occasionally radiates to his L arm. Ax4, NAD. Ambulatory at this time.

## 2014-01-05 ENCOUNTER — Other Ambulatory Visit: Payer: Self-pay | Admitting: Family Medicine

## 2014-02-04 ENCOUNTER — Other Ambulatory Visit: Payer: Self-pay | Admitting: Family Medicine

## 2014-02-08 ENCOUNTER — Ambulatory Visit (INDEPENDENT_AMBULATORY_CARE_PROVIDER_SITE_OTHER): Payer: 59 | Admitting: Family Medicine

## 2014-02-08 ENCOUNTER — Encounter: Payer: Self-pay | Admitting: Family Medicine

## 2014-02-08 VITALS — BP 130/80 | HR 85 | Temp 98.2°F | Ht 74.0 in | Wt 230.8 lb

## 2014-02-08 DIAGNOSIS — E785 Hyperlipidemia, unspecified: Secondary | ICD-10-CM

## 2014-02-08 DIAGNOSIS — Z79899 Other long term (current) drug therapy: Secondary | ICD-10-CM

## 2014-02-08 DIAGNOSIS — E119 Type 2 diabetes mellitus without complications: Secondary | ICD-10-CM

## 2014-02-08 DIAGNOSIS — I10 Essential (primary) hypertension: Secondary | ICD-10-CM

## 2014-02-08 DIAGNOSIS — Z23 Encounter for immunization: Secondary | ICD-10-CM

## 2014-02-08 MED ORDER — METFORMIN HCL ER 500 MG PO TB24: 1500.0000 mg | ORAL_TABLET | Freq: Every day | ORAL | Status: DC

## 2014-02-08 MED ORDER — LISINOPRIL 20 MG PO TABS: 20.0000 mg | ORAL_TABLET | Freq: Every day | ORAL | Status: DC

## 2014-02-08 NOTE — Progress Notes (Signed)
Pre visit review using our clinic review tool, if applicable. No additional management support is needed unless otherwise documented below in the visit note. 

## 2014-02-08 NOTE — Progress Notes (Signed)
Dr. Frederico Hamman T. Kinzly Pierrelouis, MD, [facility] [place], [address] Phone: [phone] Fax: [phone]  02/08/2014  Patient: Phillip Norman, MRN: [id number], DOB: Oct 02, 1970, 43 y.o.  Primary Physician:  Owens Loffler, MD  Chief Complaint: Medication Refill and Immunizations  Subjective:   Phillip Norman is a 43 y.o. very pleasant male patient who presents with the following:  F/u DM, HTN, Lipids.  Diabetes Mellitus: Tolerating Medications: yes Compliance with diet: fair Exercise: minimal / intermittent Avg blood sugars at home: not checking Foot problems: none Hypoglycemia: none No nausea, vomitting, blurred vision, polyuria.  Lab Results  Component Value Date   HGBA1C 10.4* 06/02/2012   HGBA1C 6.6* 10/17/2009   HGBA1C 11.6* 05/16/2009   Lab Results  Component Value Date   MICROALBUR 1.8 06/02/2012   LDLCALC 123* 06/02/2012   CREATININE 1.20 11/02/2013    Wt Readings from Last 3 Encounters:  02/08/14 230 lb 12 oz (104.668 kg)  09/16/12 230 lb 8 oz (104.554 kg)  06/02/12 237 lb 12 oz (107.843 kg)    Body mass index is 29.61 kg/(m^2).   HTN: Tolerating all medications without side effects Stable and at goal No CP, no sob. No HA.  BP Readings from Last 3 Encounters:  02/08/14 130/80  11/02/13 143/87  09/16/12 409/73    Basic Metabolic Panel:    Component Value Date/Time   NA 140 11/02/2013 0114   K 4.9 11/02/2013 0114   CL 100 11/02/2013 0114   CO2 28 11/02/2013 0114   BUN 12 11/02/2013 0114   CREATININE 1.20 11/02/2013 0114   GLUCOSE 258* 11/02/2013 0114   GLUCOSE 115* 04/19/2006 1403   CALCIUM 9.4 11/02/2013 0114   Lipids: Doing well, stable. Tolerating meds fine with no SE. Panel reviewed with patient.  Lipids:    Component Value Date/Time   CHOL 188 06/02/2012 1515   TRIG 120.0 06/02/2012 1515   HDL 41.10 06/02/2012 1515   LDLDIRECT 141.2 04/19/2006 1403   VLDL 24.0 06/02/2012 1515   CHOLHDL 5 06/02/2012  1515   Really stressful at work right now. Works on the weekend. Over a group that has gone from 16 to 6. Looking for other options.   Immunization History  Administered Date(s) Administered  . Influenza Whole 05/15/2009, 02/24/2010  . Influenza, Seasonal, Injecte, Preservative Fre 06/02/2012  . Pneumococcal Polysaccharide-23 06/20/2009    Tdap Flu shot  Eyes - not in a while  Past Medical History, Surgical History, Social History, Family History, Problem List, Medications, and Allergies have been reviewed and updated if relevant.   GEN: No acute illnesses, no fevers, chills. GI: No n/v/d, eating normally Pulm: No SOB Interactive and getting along well at home.  Otherwise, ROS is as per the HPI.  Objective:   BP 130/80  Pulse 85  Temp(Src) 98.2 F (36.8 C) (Oral)  Ht 6\' 2"  (1.88 m)  Wt 230 lb 12 oz (104.668 kg)  BMI 29.61 kg/m2  GEN: WDWN, NAD, Non-toxic, A & O x 3 HEENT: Atraumatic, Normocephalic. Neck supple. No masses, No LAD. Ears and Nose: No external deformity. CV: RRR, No M/G/R. No JVD. No thrill. No extra heart sounds. PULM: CTA B, no wheezes, crackles, rhonchi. No retractions. No resp. distress. No accessory muscle use. EXTR: No c/c/e NEURO Normal gait.  PSYCH: Normally interactive. Conversant. Not depressed or anxious appearing.  Calm demeanor.   Laboratory and Imaging Data:  Assessment and Plan:   Type II or unspecified type  diabetes mellitus without mention of complication, not stated as uncontrolled - Plan: Hemoglobin A1c, LDL cholesterol, direct  Encounter for long-term (current) use of other medications - Plan: Basic metabolic panel, Hepatic function panel  HYPERLIPIDEMIA  HYPERTENSION   Refill all meds Check DM and routine labs  Follow-up: No Follow-up on file.  New Prescriptions   No medications on file   Orders Placed This Encounter  Procedures  . Hemoglobin A1c  . Basic metabolic panel  . Hepatic function panel  . LDL  cholesterol, direct    Signed,  Hailynn Slovacek T. Jaivyn Gulla, MD   Patient's Medications  New Prescriptions   No medications on file  Previous Medications   No medications on file  Modified Medications   Modified Medication Previous Medication   LISINOPRIL (PRINIVIL,ZESTRIL) 20 MG TABLET lisinopril (PRINIVIL,ZESTRIL) 20 MG tablet      Take 1 tablet (20 mg total) by mouth daily.    Take 1 tablet (20 mg total) by mouth daily. Needs fasting labs and an office visit for increased quantity or refills.   METFORMIN (GLUCOPHAGE-XR) 500 MG 24 HR TABLET metFORMIN (GLUCOPHAGE-XR) 500 MG 24 hr tablet      Take 3 tablets (1,500 mg total) by mouth daily with breakfast.    Take 3 tablets (1,500 mg total) by mouth daily with breakfast. Needs fasting labs and an office visit for increased quantity or refills.  Discontinued Medications   CYCLOBENZAPRINE (FLEXERIL) 10 MG TABLET    Take 1 tablet (10 mg total) by mouth at bedtime as needed for muscle spasms.   DICLOFENAC (VOLTAREN) 75 MG EC TABLET    Take 1 tablet (75 mg total) by mouth 2 (two) times daily.   ESOMEPRAZOLE (NEXIUM) 40 MG CAPSULE    Take 1 capsule (40 mg total) by mouth daily.   METFORMIN (GLUCOPHAGE) 500 MG TABLET    Take 1 tablet (500 mg total) by mouth 2 (two) times daily with a meal.   SILDENAFIL (VIAGRA) 100 MG TABLET    Take 1 tablet (100 mg total) by mouth daily as needed for erectile dysfunction.

## 2014-02-08 NOTE — Patient Instructions (Signed)
Dr. Junious Silk or Dr. Diona Fanti at [facility]

## 2014-02-08 NOTE — Addendum Note (Signed)
Addended by: Carter Kitten on: 02/08/2014 05:10 PM   Modules accepted: Orders

## 2014-02-09 LAB — HEPATIC FUNCTION PANEL
ALT: 17 U/L (ref 0–53)
AST: 19 U/L (ref 0–37)
Albumin: 3.4 g/dL — ABNORMAL LOW (ref 3.5–5.2)
Alkaline Phosphatase: 60 U/L (ref 39–117)
Bilirubin, Direct: 0 mg/dL (ref 0.0–0.3)
Total Bilirubin: 0.2 mg/dL (ref 0.2–1.2)
Total Protein: 6.9 g/dL (ref 6.0–8.3)

## 2014-02-09 LAB — BASIC METABOLIC PANEL
BUN: 12 mg/dL (ref 6–23)
CALCIUM: 9.3 mg/dL (ref 8.4–10.5)
CO2: 27 mEq/L (ref 19–32)
CREATININE: 1 mg/dL (ref 0.4–1.5)
Chloride: 104 mEq/L (ref 96–112)
GFR: 102.19 mL/min (ref >60.00)
Glucose, Bld: 214 mg/dL — ABNORMAL HIGH (ref 70–99)
Potassium: 4.2 mEq/L (ref 3.5–5.1)
Sodium: 140 mEq/L (ref 135–145)

## 2014-02-09 LAB — LDL CHOLESTEROL, DIRECT: Direct LDL: 188.2 mg/dL

## 2014-02-09 LAB — HEMOGLOBIN A1C: Hgb A1c MFr Bld: 10.9 % — ABNORMAL HIGH (ref 4.6–6.5)

## 2014-02-12 ENCOUNTER — Other Ambulatory Visit: Payer: Self-pay | Admitting: Family Medicine

## 2014-02-12 ENCOUNTER — Telehealth: Payer: Self-pay | Admitting: *Deleted

## 2014-02-12 DIAGNOSIS — E119 Type 2 diabetes mellitus without complications: Secondary | ICD-10-CM

## 2014-02-12 DIAGNOSIS — E785 Hyperlipidemia, unspecified: Secondary | ICD-10-CM

## 2014-02-12 MED ORDER — METFORMIN HCL ER 500 MG PO TB24: 2000.0000 mg | ORAL_TABLET | Freq: Every day | ORAL | Status: DC

## 2014-02-12 MED ORDER — GLIPIZIDE ER 10 MG PO TB24: 10.0000 mg | ORAL_TABLET | Freq: Every day | ORAL | Status: DC

## 2014-02-12 NOTE — Telephone Encounter (Signed)
BMP, hgba1c: 250.00 FLP: 272.4

## 2014-02-12 NOTE — Telephone Encounter (Signed)
Phillip Norman notified prescription for Glipizide ER 10 mg has been sent to his pharmacy as well as a new Rx for Metformin with the new directions. Follow up appointment scheduled with Dr. Lorelei Pont for 05/14/2014 with a lab appointment for 05/10/2014.  Will forward note to Dr. Lorelei Pont to order future labs.

## 2014-02-12 NOTE — Telephone Encounter (Signed)
Message copied by Carter Kitten on Mon Feb 12, 2014 10:08 AM ------      Message from: Owens Loffler      Created: Mon Feb 12, 2014 10:02 AM       Increase metformin            Start Glipizide ER 10 mg, 1 po daily. #30, 5 refills            F/u 3 months for dm recheck ------

## 2014-05-10 ENCOUNTER — Other Ambulatory Visit (INDEPENDENT_AMBULATORY_CARE_PROVIDER_SITE_OTHER): Payer: 59

## 2014-05-10 DIAGNOSIS — E119 Type 2 diabetes mellitus without complications: Secondary | ICD-10-CM

## 2014-05-10 DIAGNOSIS — E785 Hyperlipidemia, unspecified: Secondary | ICD-10-CM

## 2014-05-10 LAB — LIPID PANEL
Cholesterol: 232 mg/dL — ABNORMAL HIGH (ref 0–200)
HDL: 45.1 mg/dL (ref >39.00)
LDL CALC: 159 mg/dL — AB (ref 0–99)
NonHDL: 186.9
Total CHOL/HDL Ratio: 5
Triglycerides: 142 mg/dL (ref 0.0–149.0)
VLDL: 28.4 mg/dL (ref 0.0–40.0)

## 2014-05-10 LAB — BASIC METABOLIC PANEL
BUN: 16 mg/dL (ref 6–23)
CO2: 28 mEq/L (ref 19–32)
Calcium: 9.1 mg/dL (ref 8.4–10.5)
Chloride: 104 mEq/L (ref 96–112)
Creatinine, Ser: 1.1 mg/dL (ref 0.4–1.5)
GFR: 93.55 mL/min (ref >60.00)
Glucose, Bld: 168 mg/dL — ABNORMAL HIGH (ref 70–99)
Potassium: 4.1 mEq/L (ref 3.5–5.1)
Sodium: 138 mEq/L (ref 135–145)

## 2014-05-10 LAB — HEMOGLOBIN A1C: Hgb A1c MFr Bld: 8.1 % — ABNORMAL HIGH (ref 4.6–6.5)

## 2014-05-14 ENCOUNTER — Encounter: Payer: Self-pay | Admitting: Family Medicine

## 2014-05-14 ENCOUNTER — Ambulatory Visit (INDEPENDENT_AMBULATORY_CARE_PROVIDER_SITE_OTHER): Payer: 59 | Admitting: Family Medicine

## 2014-05-14 VITALS — BP 120/90 | HR 81 | Temp 97.6°F | Ht 74.0 in | Wt 239.5 lb

## 2014-05-14 DIAGNOSIS — E1165 Type 2 diabetes mellitus with hyperglycemia: Secondary | ICD-10-CM

## 2014-05-14 DIAGNOSIS — E785 Hyperlipidemia, unspecified: Secondary | ICD-10-CM

## 2014-05-14 DIAGNOSIS — Z23 Encounter for immunization: Secondary | ICD-10-CM

## 2014-05-14 DIAGNOSIS — IMO0001 Reserved for inherently not codable concepts without codable children: Secondary | ICD-10-CM

## 2014-05-14 NOTE — Progress Notes (Signed)
Dr. Frederico Hamman T. Ashwika Freels, MD, [facility] [place], [address] Phone: [phone] Fax: [phone]  05/14/2014  Patient: Phillip Norman, MRN: [id number], DOB: [birth year], 43 y.o.  Primary Physician:  Owens Loffler, MD  Chief Complaint: Diabetes  Subjective:   Phillip Norman is a 43 y.o. very pleasant male patient who presents with the following:  Diabetes Mellitus: Tolerating Medications: yes Compliance with diet: fairly bad Exercise: minimal / intermittent Avg blood sugars at home: not checking Foot problems: none Hypoglycemia: none No nausea, vomitting, blurred vision, polyuria.  Lab Results  Component Value Date   HGBA1C 8.1* 05/10/2014   HGBA1C 10.9* 02/08/2014   HGBA1C 10.4* 06/02/2012   Lab Results  Component Value Date   MICROALBUR 1.8 06/02/2012   LDLCALC 159* 05/10/2014   CREATININE 1.1 05/10/2014    Wt Readings from Last 3 Encounters:  05/14/14 239 lb 8 oz (108.636 kg)  02/08/14 230 lb 12 oz (104.668 kg)  09/16/12 230 lb 8 oz (104.554 kg)    Body mass index is 30.74 kg/(m^2).   Immunization History  Administered Date(s) Administered  . Influenza Whole 05/15/2009, 02/24/2010  . Influenza, Seasonal, Injecte, Preservative Fre 06/02/2012  . Influenza,inj,Quad PF,36+ Mos 02/08/2014  . Pneumococcal Polysaccharide-23 06/20/2009  . Tdap 02/08/2014    Lipids: reluctant to start meds Panel reviewed with patient.  Lipids:    Component Value Date/Time   CHOL 232* 05/10/2014 0755   TRIG 142.0 05/10/2014 0755   HDL 45.10 05/10/2014 0755   LDLDIRECT 188.2 02/08/2014 1705   LDLDIRECT 141.2 04/19/2006 1403   VLDL 28.4 05/10/2014 0755   CHOLHDL 5 05/10/2014 0755    Lab Results  Component Value Date   ALT 17 02/08/2014   AST 19 02/08/2014   ALKPHOS 60 02/08/2014   BILITOT 0.2 02/08/2014     Past Medical History, Surgical History, Social History, Family History, Problem List, Medications, and  Allergies have been reviewed and updated if relevant.   GEN: No acute illnesses, no fevers, chills. GI: No n/v/d, eating normally Pulm: No SOB Interactive and getting along well at home.  Otherwise, ROS is as per the HPI.  Objective:   BP 120/90 mmHg  Pulse 81  Temp(Src) 97.6 F (36.4 C) (Oral)  Ht 6\' 2"  (1.88 m)  Wt 239 lb 8 oz (108.636 kg)  BMI 30.74 kg/m2  GEN: WDWN, NAD, Non-toxic, A & O x 3 HEENT: Atraumatic, Normocephalic. Neck supple. No masses, No LAD. Ears and Nose: No external deformity. CV: RRR, No M/G/R. No JVD. No thrill. No extra heart sounds. PULM: CTA B, no wheezes, crackles, rhonchi. No retractions. No resp. distress. No accessory muscle use. EXTR: No c/c/e NEURO Normal gait.  PSYCH: Normally interactive. Conversant. Not depressed or anxious appearing.  Calm demeanor.   Laboratory and Imaging Data: Results for orders placed or performed in visit on 05/10/14  Lipid panel  Result Value Ref Range   Cholesterol 232 (H) 0 - 200 mg/dL   Triglycerides 142.0 0.0 - 149.0 mg/dL   HDL 45.10 >39.00 mg/dL   VLDL 28.4 0.0 - 40.0 mg/dL   LDL Cholesterol 159 (H) 0 - 99 mg/dL   Total CHOL/HDL Ratio 5    NonHDL 035.00   Basic metabolic panel  Result Value Ref Range   Sodium 138 135 - 145 mEq/L   Potassium 4.1 3.5 - 5.1 mEq/L   Chloride 104 96 - 112 mEq/L   CO2 28 19 - 32 mEq/L  Glucose, Bld 168 (H) 70 - 99 mg/dL   BUN 16 6 - 23 mg/dL   Creatinine, Ser 1.1 0.4 - 1.5 mg/dL   Calcium 9.1 8.4 - 10.5 mg/dL   GFR 93.55 >60.00 mL/min  Hemoglobin A1c  Result Value Ref Range   Hgb A1c MFr Bld 8.1 (H) 4.6 - 6.5 %     Assessment and Plan:   Diabetes mellitus type 2, uncontrolled, without complications  Need for prophylactic vaccination against Streptococcus pneumoniae (pneumococcus) - Plan: Pneumococcal conjugate vaccine 13-valent  Hyperlipidemia LDL goal <70  >25 minutes spent in face to face time with patient, >50% spent in counselling or coordination of care:  we also spent some additional time talking about his wife who is clinically ill and received according to him 35 units of blood and had some emergent surgery. His diabetes is not that well controlled, he has been eating poorly, and he is not sleeping well at all given the situation and he is taking care of his newborn. He has gained weight, and his cholesterol is doing poorly. He was to work harder and recheck these things in 3 months.  Follow-up: Return in about 3 months (around 08/13/2014).  New Prescriptions   No medications on file   Orders Placed This Encounter  Procedures  . Pneumococcal conjugate vaccine 13-valent    Signed,  Tashonda Pinkus T. Sigurd Pugh, MD   Patient's Medications  New Prescriptions   No medications on file  Previous Medications   GLIPIZIDE (GLUCOTROL XL) 10 MG 24 HR TABLET    Take 1 tablet (10 mg total) by mouth daily with breakfast.   LISINOPRIL (PRINIVIL,ZESTRIL) 20 MG TABLET    Take 1 tablet (20 mg total) by mouth daily.   METFORMIN (GLUCOPHAGE-XR) 500 MG 24 HR TABLET    Take 4 tablets (2,000 mg total) by mouth daily with breakfast.  Modified Medications   No medications on file  Discontinued Medications   No medications on file

## 2014-05-14 NOTE — Progress Notes (Signed)
Pre visit review using our clinic review tool, if applicable. No additional management support is needed unless otherwise documented below in the visit note. 

## 2014-09-14 ENCOUNTER — Other Ambulatory Visit: Payer: Self-pay | Admitting: Family Medicine

## 2014-11-15 ENCOUNTER — Telehealth: Payer: Self-pay

## 2014-11-15 NOTE — Telephone Encounter (Signed)
Diabetic Bundle. Left voicemail advising pt her A1C blood test is due. Pt advised to contact PCP's office to schedule.  

## 2014-12-17 ENCOUNTER — Telehealth: Payer: Self-pay

## 2014-12-17 NOTE — Telephone Encounter (Signed)
Pt left v/m;pt drove to [facility] on vacation until 12/26/14 and feet are swollen; pt thinks BP med is causing swelling. Pt request med to get rid of excess fluid in pts feet, ankles and lower legs. Swelling goes down significantly while in bed at night. No SOB or CP. [place]. Pt request cb.

## 2014-12-17 NOTE — Telephone Encounter (Signed)
Lisinopril doesn't normally cause swelling. More likely heat and long car ride.   Put feet up. He can get compression socks if he wants.   Diuretics should not be used lightly, particularly in 100 degree heat, where they can significantly increase dehydration risk and multiple other problems.  Electronically Signed  By: Owens Loffler, MD On: 12/17/2014 5:48 PM

## 2014-12-18 NOTE — Telephone Encounter (Signed)
Phillip Norman notified as instructed by telephone.  He states the swelling started before driving to [place].  I recommended that when he is in hotel room try to elevate his feet and where compression socks.  Phillip Norman states understanding.

## 2015-02-25 ENCOUNTER — Other Ambulatory Visit: Payer: Self-pay | Admitting: Family Medicine

## 2015-02-25 NOTE — Telephone Encounter (Signed)
Last office visit 05/14/2014.  Office note states to return in 3 months.  No future appointments scheduled.  Ok to refill?

## 2015-03-01 ENCOUNTER — Other Ambulatory Visit: Payer: Self-pay | Admitting: Family Medicine

## 2015-03-01 NOTE — Telephone Encounter (Signed)
Please call and schedule Diabetic Follow up with labs prior for Dr. Lorelei Pont.

## 2015-03-26 ENCOUNTER — Telehealth: Payer: Self-pay | Admitting: Family Medicine

## 2015-03-26 NOTE — Telephone Encounter (Signed)
Please call and schedule CPE with fasting labs with Dr. Copland. 

## 2015-03-26 NOTE — Telephone Encounter (Signed)
Lab 11/22 cpx 11/30 Pt aware  Please close

## 2015-04-11 ENCOUNTER — Other Ambulatory Visit: Payer: Self-pay | Admitting: Family Medicine

## 2015-04-11 DIAGNOSIS — E785 Hyperlipidemia, unspecified: Secondary | ICD-10-CM

## 2015-04-11 DIAGNOSIS — Z79899 Other long term (current) drug therapy: Secondary | ICD-10-CM

## 2015-04-11 DIAGNOSIS — Z125 Encounter for screening for malignant neoplasm of prostate: Secondary | ICD-10-CM

## 2015-04-11 DIAGNOSIS — E119 Type 2 diabetes mellitus without complications: Secondary | ICD-10-CM

## 2015-04-16 ENCOUNTER — Other Ambulatory Visit (INDEPENDENT_AMBULATORY_CARE_PROVIDER_SITE_OTHER): Payer: 59

## 2015-04-16 DIAGNOSIS — E119 Type 2 diabetes mellitus without complications: Secondary | ICD-10-CM | POA: Diagnosis not present

## 2015-04-16 DIAGNOSIS — Z79899 Other long term (current) drug therapy: Secondary | ICD-10-CM | POA: Diagnosis not present

## 2015-04-16 DIAGNOSIS — E785 Hyperlipidemia, unspecified: Secondary | ICD-10-CM | POA: Diagnosis not present

## 2015-04-16 DIAGNOSIS — Z125 Encounter for screening for malignant neoplasm of prostate: Secondary | ICD-10-CM

## 2015-04-16 LAB — PSA: PSA: 1.21 ng/mL (ref 0.10–4.00)

## 2015-04-16 LAB — HEPATIC FUNCTION PANEL
ALK PHOS: 59 U/L (ref 39–117)
ALT: 16 U/L (ref 0–53)
AST: 16 U/L (ref 0–37)
Albumin: 3.4 g/dL — ABNORMAL LOW (ref 3.5–5.2)
BILIRUBIN DIRECT: 0.1 mg/dL (ref 0.0–0.3)
BILIRUBIN TOTAL: 0.4 mg/dL (ref 0.2–1.2)
Total Protein: 6.1 g/dL (ref 6.0–8.3)

## 2015-04-16 LAB — CBC WITH DIFFERENTIAL/PLATELET
BASOS PCT: 0.4 % (ref 0.0–3.0)
Basophils Absolute: 0 10*3/uL (ref 0.0–0.1)
EOS PCT: 0.9 % (ref 0.0–5.0)
Eosinophils Absolute: 0.1 10*3/uL (ref 0.0–0.7)
HEMATOCRIT: 43 % (ref 39.0–52.0)
Hemoglobin: 13.5 g/dL (ref 13.0–17.0)
LYMPHS ABS: 2.7 10*3/uL (ref 0.7–4.0)
Lymphocytes Relative: 45.4 % (ref 12.0–46.0)
MCHC: 31.4 g/dL (ref 30.0–36.0)
MCV: 87.9 fl (ref 78.0–100.0)
MONO ABS: 0.3 10*3/uL (ref 0.1–1.0)
MONOS PCT: 5.2 % (ref 3.0–12.0)
Neutro Abs: 2.9 10*3/uL (ref 1.4–7.7)
Neutrophils Relative %: 48.1 % (ref 43.0–77.0)
Platelets: 239 10*3/uL (ref 150.0–400.0)
RBC: 4.89 Mil/uL (ref 4.22–5.81)
RDW: 14.6 % (ref 11.5–15.5)
WBC: 6 10*3/uL (ref 4.0–10.5)

## 2015-04-16 LAB — LIPID PANEL
Cholesterol: 295 mg/dL — ABNORMAL HIGH (ref 0–200)
HDL: 60.4 mg/dL (ref >39.00)
LDL Cholesterol: 205 mg/dL — ABNORMAL HIGH (ref 0–99)
NONHDL: 234.94
Total CHOL/HDL Ratio: 5
Triglycerides: 151 mg/dL — ABNORMAL HIGH (ref 0.0–149.0)
VLDL: 30.2 mg/dL (ref 0.0–40.0)

## 2015-04-16 LAB — BASIC METABOLIC PANEL
BUN: 14 mg/dL (ref 6–23)
CHLORIDE: 103 meq/L (ref 96–112)
CO2: 32 meq/L (ref 19–32)
Calcium: 9.3 mg/dL (ref 8.4–10.5)
Creatinine, Ser: 0.93 mg/dL (ref 0.40–1.50)
GFR: 113.07 mL/min (ref >60.00)
GLUCOSE: 170 mg/dL — AB (ref 70–99)
POTASSIUM: 4.1 meq/L (ref 3.5–5.1)
Sodium: 140 mEq/L (ref 135–145)

## 2015-04-16 LAB — MICROALBUMIN / CREATININE URINE RATIO
Creatinine,U: 46.2 mg/dL
MICROALB/CREAT RATIO: 209 mg/g — AB (ref 0.0–30.0)
Microalb, Ur: 96.6 mg/dL — ABNORMAL HIGH (ref 0.0–1.9)

## 2015-04-16 LAB — HEMOGLOBIN A1C: HEMOGLOBIN A1C: 8.1 % — AB (ref 4.6–6.5)

## 2015-04-24 ENCOUNTER — Ambulatory Visit (INDEPENDENT_AMBULATORY_CARE_PROVIDER_SITE_OTHER): Payer: 59 | Admitting: Family Medicine

## 2015-04-24 ENCOUNTER — Other Ambulatory Visit: Payer: Self-pay | Admitting: Family Medicine

## 2015-04-24 VITALS — BP 140/84 | HR 113 | Temp 98.5°F | Ht 74.25 in | Wt 233.0 lb

## 2015-04-24 DIAGNOSIS — IMO0001 Reserved for inherently not codable concepts without codable children: Secondary | ICD-10-CM

## 2015-04-24 DIAGNOSIS — Z23 Encounter for immunization: Secondary | ICD-10-CM

## 2015-04-24 DIAGNOSIS — I10 Essential (primary) hypertension: Secondary | ICD-10-CM

## 2015-04-24 DIAGNOSIS — Z0000 Encounter for general adult medical examination without abnormal findings: Secondary | ICD-10-CM | POA: Diagnosis not present

## 2015-04-24 DIAGNOSIS — E1165 Type 2 diabetes mellitus with hyperglycemia: Secondary | ICD-10-CM

## 2015-04-24 DIAGNOSIS — E785 Hyperlipidemia, unspecified: Secondary | ICD-10-CM

## 2015-04-24 MED ORDER — LISINOPRIL 20 MG PO TABS: ORAL_TABLET | ORAL | Status: DC

## 2015-04-24 NOTE — Progress Notes (Signed)
Dr. Frederico Hamman T. Samaj Wessells, MD, [facility] [place], [address] Phone: [phone] Fax: [phone]  04/24/2015  Patient: Phillip Norman, MRN: [id number], DOB: September 21, 1970, 44 y.o.  Primary Physician:  Owens Loffler, MD   Chief Complaint  Patient presents with  . Annual Exam   Subjective:   Phillip Norman is a 44 y.o. pleasant patient who presents with the following:  Preventative Health Maintenance Visit:  Health Maintenance Summary Reviewed and updated, unless pt declines services.  Tobacco History Reviewed. Alcohol: No concerns, no excessive use Exercise Habits: none,  rec at least 30 mins 5 times a week STD concerns: no risk or activity to increase risk Drug Use: None Encouraged self-testicular check  Extremely stressed out. Eating chips Sleeping 4-5 hours a night only No exercise.   Lab Results  Component Value Date   HGBA1C 8.1* 04/16/2015    Lipids:    Component Value Date/Time   CHOL 295* 04/16/2015 0925   TRIG 151.0* 04/16/2015 0925   TRIG 147 04/19/2006 1403   HDL 60.40 04/16/2015 0925   LDLDIRECT 188.2 02/08/2014 1705   LDLDIRECT 141.2 04/19/2006 1403   VLDL 30.2 04/16/2015 0925   CHOLHDL 5 04/16/2015 0925   CHOLHDL 4.8 CALC 04/19/2006 1403     Health Maintenance  Topic Date Due  . OPHTHALMOLOGY EXAM  06/07/1980  . HIV Screening  06/07/1985  . PNEUMOCOCCAL POLYSACCHARIDE VACCINE (2) 06/20/2014  . FOOT EXAM  02/09/2015  . HEMOGLOBIN A1C  10/14/2015  . INFLUENZA VACCINE  12/24/2015  . URINE MICROALBUMIN  04/15/2016  . TETANUS/TDAP  02/09/2024   Immunization History  Administered Date(s) Administered  . Influenza Whole 05/15/2009, 02/24/2010  . Influenza, Seasonal, Injecte, Preservative Fre 06/02/2012  . Influenza,inj,Quad PF,36+ Mos 02/08/2014, 04/24/2015  . Pneumococcal Conjugate-13 05/14/2014  . Pneumococcal Polysaccharide-23 06/20/2009  . Tdap 02/08/2014   Patient Active  Problem List   Diagnosis Date Noted  . Hyperlipidemia LDL goal <70 06/21/2009  . Diabetes mellitus type 2, uncontrolled, without complications ([place]) 22/63/3354  . Essential hypertension 04/01/2009  . GERD 04/01/2009  . BACK PAIN, LUMBAR, CHRONIC 04/01/2009   Past Medical History  Diagnosis Date  . Hypertension   . Chronic back pain   . Diabetes mellitus without complication   . Hyperlipidemia    Past Surgical History  Procedure Laterality Date  . Tonsillectomy and adenoidectomy     Social History   Social History  . Marital Status: Married    Spouse Name: N/A  . Number of Children: 1  . Years of Education: N/A   Occupational History  . SENIOR FINANCE MANAGER    Social History Main Topics  . Smoking status: Never Smoker   . Smokeless tobacco: Never Used  . Alcohol Use: No  . Drug Use: No  . Sexual Activity: Not on file   Other Topics Concern  . Not on file   Social History Narrative   REGULAR EXERCISE-NO   MASTER LEVEL EDUCATION   No family history on file. No Known Allergies  Medication list has been reviewed and updated.   General: Denies fever, chills, sweats. No significant weight loss. Eyes: Denies blurring,significant itching ENT: Denies earache, sore throat, and hoarseness. Cardiovascular: Denies chest pains, palpitations, dyspnea on exertion Respiratory: Denies cough, dyspnea at rest,wheeezing Breast: no concerns about lumps GI: Denies nausea, vomiting, diarrhea, constipation, change in bowel habits, abdominal pain, melena, hematochezia GU: Denies penile discharge, ED, urinary flow / outflow problems. No STD concerns.  Musculoskeletal: Denies back pain, joint pain Derm: Denies rash, itching Neuro: Denies  paresthesias, frequent falls, frequent headaches Psych: Denies depression, anxiety. STRESS Endocrine: Denies cold intolerance, heat intolerance, polydipsia Heme: Denies enlarged lymph nodes Allergy: No hayfever  Objective:   BP 140/84 mmHg   Pulse 113  Temp(Src) 98.5 F (36.9 C) (Oral)  Ht 6' 2.25" (1.886 m)  Wt 233 lb (105.688 kg)  BMI 29.71 kg/m2 Ideal Body Weight: Weight in (lb) to have BMI = 25: 195.6  No exam data present  GEN: well developed, well nourished, no acute distress Eyes: conjunctiva and lids normal, PERRLA, EOMI ENT: TM clear, nares clear, oral exam WNL Neck: supple, no lymphadenopathy, no thyromegaly, no JVD Pulm: clear to auscultation and percussion, respiratory effort normal CV: regular rate and rhythm, S1-S2, no murmur, rub or gallop, no bruits, peripheral pulses normal and symmetric, no cyanosis, clubbing, edema or varicosities GI: soft, non-tender; no hepatosplenomegaly, masses; active bowel sounds all quadrants GU: no hernia, testicular mass, penile discharge Lymph: no cervical, axillary or inguinal adenopathy MSK: gait normal, muscle tone and strength WNL, no joint swelling, effusions, discoloration, crepitus  SKIN: clear, good turgor, color WNL, no rashes, lesions, or ulcerations Neuro: normal mental status, normal strength, sensation, and motion Psych: alert; oriented to person, place and time, normally interactive and not anxious or depressed in appearance. All labs reviewed with patient.  Lipids:    Component Value Date/Time   CHOL 295* 04/16/2015 0925   TRIG 151.0* 04/16/2015 0925   TRIG 147 04/19/2006 1403   HDL 60.40 04/16/2015 0925   LDLDIRECT 188.2 02/08/2014 1705   LDLDIRECT 141.2 04/19/2006 1403   VLDL 30.2 04/16/2015 0925   CHOLHDL 5 04/16/2015 0925   CHOLHDL 4.8 CALC 04/19/2006 1403   CBC: CBC Latest Ref Rng 04/16/2015 11/02/2013 06/02/2012  WBC 4.0 - 10.5 K/uL 6.0 6.1 6.8  Hemoglobin 13.0 - 17.0 g/dL 13.5 13.5 13.2  Hematocrit 39.0 - 52.0 % 43.0 41.2 40.6  Platelets 150.0 - 400.0 K/uL 239.0 219 199.0    Basic Metabolic Panel:    Component Value Date/Time   NA 140 04/16/2015 0925   K 4.1 04/16/2015 0925   CL 103 04/16/2015 0925   CO2 32 04/16/2015 0925   BUN 14  04/16/2015 0925   CREATININE 0.93 04/16/2015 0925   GLUCOSE 170* 04/16/2015 0925   GLUCOSE 115* 04/19/2006 1403   CALCIUM 9.3 04/16/2015 0925   Hepatic Function Latest Ref Rng 04/16/2015 02/08/2014 05/15/2009  Total Protein 6.0 - 8.3 g/dL 6.1 6.9 7.1  Albumin 3.5 - 5.2 g/dL 3.4(L) 3.4(L) 3.8  AST 0 - 37 U/L 16 19 18  ALT 0 - 53 U/L 16 17 20  Alk Phosphatase 39 - 117 U/L 59 60 75  Total Bilirubin 0.2 - 1.2 mg/dL 0.4 0.2 0.7  Bilirubin, Direct 0.0 - 0.3 mg/dL 0.1 0.0 0.1    Lab Results  Component Value Date   TSH 2.07 05/15/2009   Lab Results  Component Value Date   PSA 1.21 04/16/2015    Assessment and Plan:   Healthcare maintenance  Need for prophylactic vaccination and inoculation against influenza - Plan: Flu Vaccine QUAD 36+ mos IM  Uncontrolled type 2 diabetes mellitus without complication, without long-term current use of insulin (HCC)  Hyperlipidemia LDL goal <70  Essential hypertension   Tremendous stress, poor compliance with diet, no exercise. This needs to be priority. Not interested in starting chol medicine.  Health Maintenance Exam: The patient's preventative maintenance and recommended screening tests for   an annual wellness exam were reviewed in full today. Brought up to date unless services declined.  Counselled on the importance of diet, exercise, and its role in overall health and mortality. The patient's FH and SH was reviewed, including their home life, tobacco status, and drug and alcohol status.  Follow-up: No Follow-up on file. Unless noted, follow-up in 1 year for Health Maintenance Exam.  New Prescriptions   No medications on file   Modified Medications   Modified Medication Previous Medication   GLIPIZIDE (GLUCOTROL XL) 10 MG 24 HR TABLET glipiZIDE (GLUCOTROL XL) 10 MG 24 hr tablet      TAKE 1 TABLET BY MOUTH DAILY WITH BREAKFAST    TAKE 1 TABLET (10 MG TOTAL) BY MOUTH DAILY WITH BREAKFAST.   LISINOPRIL (PRINIVIL,ZESTRIL) 20 MG TABLET  lisinopril (PRINIVIL,ZESTRIL) 20 MG tablet      TAKE 1 TABLET (20 MG TOTAL) BY MOUTH DAILY.    TAKE 1 TABLET (20 MG TOTAL) BY MOUTH DAILY.   Orders Placed This Encounter  Procedures  . Flu Vaccine QUAD 36+ mos IM    Signed,  Spencer T. Copland, MD   Patient's Medications  New Prescriptions   No medications on file  Previous Medications   METFORMIN (GLUCOPHAGE-XR) 500 MG 24 HR TABLET    TAKE 3 TABLETS (1,500 MG TOTAL) BY MOUTH DAILY WITH BREAKFAST.  Modified Medications   Modified Medication Previous Medication   GLIPIZIDE (GLUCOTROL XL) 10 MG 24 HR TABLET glipiZIDE (GLUCOTROL XL) 10 MG 24 hr tablet      TAKE 1 TABLET BY MOUTH DAILY WITH BREAKFAST    TAKE 1 TABLET (10 MG TOTAL) BY MOUTH DAILY WITH BREAKFAST.   LISINOPRIL (PRINIVIL,ZESTRIL) 20 MG TABLET lisinopril (PRINIVIL,ZESTRIL) 20 MG tablet      TAKE 1 TABLET (20 MG TOTAL) BY MOUTH DAILY.    TAKE 1 TABLET (20 MG TOTAL) BY MOUTH DAILY.  Discontinued Medications   No medications on file      

## 2015-04-24 NOTE — Progress Notes (Signed)
Pre visit review using our clinic review tool, if applicable. No additional management support is needed unless otherwise documented below in the visit note. 

## 2015-06-11 ENCOUNTER — Emergency Department
Admission: EM | Admit: 2015-06-11 | Discharge: 2015-06-11 | Disposition: A | Discharge status: Home / Self Care | Payer: 59 | Attending: Emergency Medicine | Admitting: Emergency Medicine

## 2015-06-11 ENCOUNTER — Encounter: Payer: Self-pay | Admitting: Emergency Medicine

## 2015-06-11 ENCOUNTER — Encounter (INDEPENDENT_AMBULATORY_CARE_PROVIDER_SITE_OTHER): Payer: Self-pay

## 2015-06-11 ENCOUNTER — Ambulatory Visit (INDEPENDENT_AMBULATORY_CARE_PROVIDER_SITE_OTHER): Payer: 59 | Admitting: Family Medicine

## 2015-06-11 ENCOUNTER — Emergency Department: Payer: 59

## 2015-06-11 ENCOUNTER — Encounter: Payer: Self-pay | Admitting: Family Medicine

## 2015-06-11 VITALS — BP 136/98 | HR 88 | Temp 98.5°F | Ht 74.0 in | Wt 236.0 lb

## 2015-06-11 DIAGNOSIS — Z79899 Other long term (current) drug therapy: Secondary | ICD-10-CM | POA: Diagnosis not present

## 2015-06-11 DIAGNOSIS — R079 Chest pain, unspecified: Secondary | ICD-10-CM | POA: Insufficient documentation

## 2015-06-11 DIAGNOSIS — R109 Unspecified abdominal pain: Secondary | ICD-10-CM

## 2015-06-11 DIAGNOSIS — Z7984 Long term (current) use of oral hypoglycemic drugs: Secondary | ICD-10-CM | POA: Insufficient documentation

## 2015-06-11 DIAGNOSIS — R739 Hyperglycemia, unspecified: Secondary | ICD-10-CM

## 2015-06-11 DIAGNOSIS — E1165 Type 2 diabetes mellitus with hyperglycemia: Secondary | ICD-10-CM | POA: Insufficient documentation

## 2015-06-11 DIAGNOSIS — R531 Weakness: Secondary | ICD-10-CM | POA: Insufficient documentation

## 2015-06-11 DIAGNOSIS — R0789 Other chest pain: Secondary | ICD-10-CM

## 2015-06-11 DIAGNOSIS — I10 Essential (primary) hypertension: Secondary | ICD-10-CM | POA: Insufficient documentation

## 2015-06-11 LAB — CBC
HCT: 40.5 % (ref 40.0–52.0)
HEMOGLOBIN: 13.1 g/dL (ref 13.0–18.0)
MCH: 27.7 pg (ref 26.0–34.0)
MCHC: 32.3 g/dL (ref 32.0–36.0)
MCV: 85.7 fL (ref 80.0–100.0)
Platelets: 232 10*3/uL (ref 150–440)
RBC: 4.72 MIL/uL (ref 4.40–5.90)
RDW: 14 % (ref 11.5–14.5)
WBC: 6.2 10*3/uL (ref 3.8–10.6)

## 2015-06-11 LAB — URINALYSIS COMPLETE WITH MICROSCOPIC (ARMC ONLY)
Bacteria, UA: NONE SEEN
Bilirubin Urine: NEGATIVE
GLUCOSE, UA: 50 mg/dL — AB
KETONES UR: NEGATIVE mg/dL
Leukocytes, UA: NEGATIVE
NITRITE: NEGATIVE
Protein, ur: >500 mg/dL — AB
SPECIFIC GRAVITY, URINE: 1.013 (ref 1.005–1.030)
Squamous Epithelial / LPF: NONE SEEN
pH: 6 (ref 5.0–8.0)

## 2015-06-11 LAB — COMPREHENSIVE METABOLIC PANEL
ALK PHOS: 60 U/L (ref 38–126)
ALT: 18 U/L (ref 17–63)
ANION GAP: 5 (ref 5–15)
AST: 16 U/L (ref 15–41)
Albumin: 3.3 g/dL — ABNORMAL LOW (ref 3.5–5.0)
BUN: 10 mg/dL (ref 6–20)
CO2: 29 mmol/L (ref 22–32)
Calcium: 8.8 mg/dL — ABNORMAL LOW (ref 8.9–10.3)
Chloride: 104 mmol/L (ref 101–111)
Creatinine, Ser: 0.94 mg/dL (ref 0.61–1.24)
GFR calc Af Amer: >60 mL/min (ref >60)
GFR calc non Af Amer: >60 mL/min (ref >60)
GLUCOSE: 202 mg/dL — AB (ref 65–99)
POTASSIUM: 3.7 mmol/L (ref 3.5–5.1)
SODIUM: 138 mmol/L (ref 135–145)
Total Bilirubin: 0.5 mg/dL (ref 0.3–1.2)
Total Protein: 6.7 g/dL (ref 6.5–8.1)

## 2015-06-11 LAB — APTT: APTT: 26 s (ref 24–36)

## 2015-06-11 LAB — PROTIME-INR
INR: 0.94
PROTHROMBIN TIME: 12.8 s (ref 11.4–15.0)

## 2015-06-11 LAB — TSH: TSH: 5.467 u[IU]/mL — ABNORMAL HIGH (ref 0.350–4.500)

## 2015-06-11 LAB — BRAIN NATRIURETIC PEPTIDE: B Natriuretic Peptide: 56 pg/mL (ref 0.0–100.0)

## 2015-06-11 LAB — TROPONIN I: Troponin I: <0.03 ng/mL (ref <0.031)

## 2015-06-11 LAB — MAGNESIUM: Magnesium: 2 mg/dL (ref 1.7–2.4)

## 2015-06-11 NOTE — Assessment & Plan Note (Signed)
Left mid abdomen with mild tenderness to palpation. This issue has been occurring for the last several months. It is not associated with any other GI symptoms. He has no guarding or rebound to indicate peritonitis. Patient is going to the emergency room for chest pain evaluation thus we will consider evaluation of his abdominal discomfort at follow-up depending on workup in the ED.

## 2015-06-11 NOTE — Progress Notes (Signed)
Pre visit review using our clinic review tool, if applicable. No additional management support is needed unless otherwise documented below in the visit note. 

## 2015-06-11 NOTE — Assessment & Plan Note (Addendum)
Patient with typical sounding chest pain intermittently for several weeks, though persistent for the last day and a half. Chest pressure present at this time. EKG with changes in the lateral leads concerning for cardiac ischemia. Patient with risk factors including hyperlipidemia, hypertension, and diabetes. Given this patient was advised that he needed evaluation in the ED for this. He was given 324 mg of aspirin in the office. Given that he was transporting himself he was not given nitroglycerin. He was advised EMS transport, though opted to drive himself. He signed AMA paperwork. He was given precautions to call EMS while he is in route to the ED.

## 2015-06-11 NOTE — Discharge Instructions (Signed)
Please take all your medications as prescribed. Please check your blood sugars and record them to bring with you to your primary care doctor during your follow-up appointment. Your primary care doctor can also follow-up your thyroid panel test.  Please return to the emergency department if you develop chest pain, shortness of breath, palpitations, lightheadedness or fainting, or any other symptoms concerning to you.

## 2015-06-11 NOTE — Patient Instructions (Signed)
Nice to meet you. Her chest pain is concerning for a heart issue. She needed to go to the emergency room for evaluation. I have advised EMS transport, though you are going to drive herself. If you develop worsening chest pain, shortness of breath, palpitations, or any new or change in symptoms in route to the ED please seek medical attention.

## 2015-06-11 NOTE — ED Notes (Signed)
EKG completed and read by Dr. Mariea Clonts

## 2015-06-11 NOTE — Progress Notes (Signed)
Patient ID: Phillip Norman, male   DOB: Mar 15, 1971, 45 y.o.   MRN: [id number]  [name], MD Phone: [phone]  Phillip Norman is a 45 y.o. male who presents today for new patient visit. Patient used to be a patient of Dr. Arlyn Dunning and is transferring care to our office.  Patient notes on Sunday he developed a discomfort in the right lateral portion of his chest and under his right arm. He notes he felt weak overall with this. And had to sit down. He noted it felt as though he needed to catch his breath and he had a central chest pressure with it. He notes this is intermittently been going on for the last month. Describes the shortness of breath is trying to catch his breath. Slight pressure in the center of his chest that is intermittent and exertional. It is not associated with diaphoresis or radiation. He does note he does not feel as strong as usual. No focal weakness or numbness. Does have a history of diabetes, hypertension, and hyperlipidemia. No family history of cardiac issues. No personal history of cardiac issues.  Patient additionally noted during physical exam after pressing on his abdomen that he had had mild left-sided abdominal discomfort intermittently for several months. He denies nausea, vomiting, diarrhea, and constipation. No blood in the stool. Notes it comes and goes.  PMH: nonsmoker.   ROS see HPI  Objective  Physical Exam Filed Vitals:   06/11/15 0814  BP: 136/98  Pulse: 88  Temp: 98.5 F (36.9 C)    BP Readings from Last 3 Encounters:  06/11/15 136/98  06/11/15 159/105  04/24/15 140/84   Wt Readings from Last 3 Encounters:  06/11/15 236 lb (107.049 kg)  06/11/15 235 lb (106.595 kg)  04/24/15 233 lb (105.688 kg)    Physical Exam  Constitutional: He is well-developed, well-nourished, and in no distress.  HENT:  Head: Normocephalic and atraumatic.  Right Ear: External ear normal.  Left Ear: External ear normal.  Mouth/Throat: Oropharynx is clear  and moist. No oropharyngeal exudate.  Eyes: Conjunctivae are normal. Pupils are equal, round, and reactive to light.  Neck: Neck supple.  Cardiovascular: Normal rate, regular rhythm and normal heart sounds.  Exam reveals no gallop and no friction rub.   No murmur heard. Pulmonary/Chest: Effort normal and breath sounds normal. No respiratory distress. He has no wheezes. He has no rales.  Abdominal: Soft. Bowel sounds are normal. He exhibits no distension. There is no tenderness. There is no rebound and no guarding.  Musculoskeletal: He exhibits no edema.  Lymphadenopathy:    He has no cervical adenopathy.  Neurological: He is alert. Gait normal.  Skin: Skin is warm and dry. He is not diaphoretic.     Assessment/Plan: Please see individual problem list.  Chest pain Patient with typical sounding chest pain intermittently for several weeks, though persistent for the last day and a half. Chest pressure present at this time. EKG with changes in the lateral leads concerning for cardiac ischemia. Patient with risk factors including hyperlipidemia, hypertension, and diabetes. Given this patient was advised that he needed evaluation in the ED for this. He was given 324 mg of aspirin in the office. Given that he was transporting himself he was not given nitroglycerin. He was advised EMS transport, though opted to drive himself. He signed AMA paperwork. He was given precautions to call EMS while he is in route to the ED.  Abdominal discomfort Left mid abdomen with mild tenderness to palpation. This issue  has been occurring for the last several months. It is not associated with any other GI symptoms. He has no guarding or rebound to indicate peritonitis. Patient is going to the emergency room for chest pain evaluation thus we will consider evaluation of his abdominal discomfort at follow-up depending on workup in the ED.    Orders Placed This Encounter  Procedures  . EKG 12-Lead     [name]

## 2015-06-11 NOTE — ED Provider Notes (Signed)
[facility] Emergency Department Provider Note  ____________________________________________  Time seen: Approximately 11:05 AM  I have reviewed the triage vital signs and the nursing notes.   HISTORY  Chief Complaint Chest Pain    HPI Phillip Norman is a 45 y.o. male with a history of HTN, HL, DM presenting with chest pain and generalized weakness. Patient reports that for 1 month he has been having 2-3 daily episodes of a central chest "pressure" that does not radiate and is not associated with shortness of breath, nausea or vomiting, diaphoresis or palpitations. The episodes can last up to one hour and resolved with rest. The past 2-3 days the patient has had a more constant chest pain which is now "dull." The patient reports a history of remote stress test that was negative. He also reports an overall generalized weakness over the same period of time. He denies any change in his medications or urinary symptoms. He denies tobacco abuse or cocaine use. Lower extremity swelling, cough or cold symptoms, fever.   Past Medical History  Diagnosis Date  . Hypertension   . Chronic back pain   . Diabetes mellitus without complication ([place])   . Hyperlipidemia     Patient Active Problem List   Diagnosis Date Noted  . Chest pain 06/11/2015  . Abdominal discomfort 06/11/2015  . Hyperlipidemia LDL goal <70 06/21/2009  . Diabetes mellitus type 2, uncontrolled, without complications ([place]) [id number]  . Essential hypertension 04/01/2009  . GERD 04/01/2009  . BACK PAIN, LUMBAR, CHRONIC 04/01/2009    Past Surgical History  Procedure Laterality Date  . Tonsillectomy and adenoidectomy      Current Outpatient Rx  Name  Route  Sig  Dispense  Refill  . glipiZIDE (GLUCOTROL XL) 10 MG 24 hr tablet      TAKE 1 TABLET BY MOUTH DAILY WITH BREAKFAST   90 tablet   3   . lisinopril (PRINIVIL,ZESTRIL) 20 MG tablet      TAKE 1 TABLET (20 MG TOTAL) BY MOUTH DAILY.   90  tablet   3   . metFORMIN (GLUCOPHAGE-XR) 500 MG 24 hr tablet      TAKE 3 TABLETS (1,500 MG TOTAL) BY MOUTH DAILY WITH BREAKFAST.   90 tablet   2     Allergies Review of patient's allergies indicates no known allergies.  No family history on file.  Social History Social History  Substance Use Topics  . Smoking status: Never Smoker   . Smokeless tobacco: Never Used  . Alcohol Use: No    Review of Systems Constitutional: No fever/chills. No lightheadedness or syncope. Positive generalized weakness. Eyes: No visual changes. ENT: No sore throat. Cardiovascular: Positive chest pain, no palpitations. Respiratory: Denies shortness of breath.  No cough. Gastrointestinal: No abdominal pain.  No nausea, no vomiting.  No diarrhea.  No constipation. Genitourinary: Negative for dysuria. Musculoskeletal: Negative for back pain. Skin: Negative for rash. Neurological: Negative for headaches, focal weakness or numbness.  10-point ROS otherwise negative.  ____________________________________________   PHYSICAL EXAM:  VITAL SIGNS: ED Triage Vitals  Enc Vitals Group     BP 06/11/15 0920 159/105 mmHg     Pulse Rate 06/11/15 0920 74     Resp 06/11/15 0920 16     Temp 06/11/15 0920 98.5 F (36.9 C)     Temp Source 06/11/15 0920 Oral     SpO2 --      Weight 06/11/15 0920 235 lb (106.595 kg)     Height 06/11/15 0920  6\' 2"  (1.88 m)     Head Cir --      Peak Flow --      Pain Score 06/11/15 0913 3     Pain Loc --      Pain Edu? --      Excl. in [place]? --     Constitutional: Alert and oriented. Well appearing and in no acute distress. Answer question appropriately. Eyes: Conjunctivae are normal.  EOMI. no scleral icterus. Head: Atraumatic. Nose: No congestion/rhinnorhea. Mouth/Throat: Mucous membranes are moist.  Neck: No stridor.  Supple.  No JVD. Cardiovascular: Normal rate, regular rhythm. No murmurs, rubs or gallops.  Respiratory: Normal respiratory effort.  No retractions.  Lungs CTAB.  No wheezes, rales or ronchi. Gastrointestinal: Obese. Soft and nontender. No distention. No peritoneal signs. Musculoskeletal: No LE edema. No palpable cords, tenderness to palpation in the calves, or Homans sign. Neurologic:  Normal speech and language. No gross focal neurologic deficits are appreciated.  Skin:  Skin is warm, dry and intact. No rash noted. Psychiatric: Flat affect with normal mood.Marland Kitchen Speech and behavior are normal.  Normal judgement. ____________________________________________   LABS (all labs ordered are listed, but only abnormal results are displayed)  Labs Reviewed  COMPREHENSIVE METABOLIC PANEL - Abnormal; Notable for the following:    Glucose, Bld 202 (*)    Calcium 8.8 (*)    Albumin 3.3 (*)    All other components within normal limits  URINALYSIS COMPLETEWITH MICROSCOPIC ([facility] ONLY) - Abnormal; Notable for the following:    Color, Urine YELLOW (*)    APPearance CLEAR (*)    Glucose, UA 50 (*)    Hgb urine dipstick 2+ (*)    Protein, ur >500 (*)    All other components within normal limits  APTT  CBC  BRAIN NATRIURETIC PEPTIDE  MAGNESIUM  PROTIME-INR  TROPONIN I  TSH   ____________________________________________  EKG  ED ECG REPORT I, Eula Listen, the attending physician, personally viewed and interpreted this ECG.   Date: 06/11/2015  EKG Time: 913  Rate: 75  Rhythm: normal sinus rhythm  Axis: Leftward  Intervals:none  ST&T Change: Nonspecific T-wave inversion in V6. No ST elevation. No ischemic changes.  ____________________________________________  RADIOLOGY  Dg Chest 2 View  06/11/2015  CLINICAL DATA:  Chest pain for 1 month. EXAM: CHEST  2 VIEW COMPARISON:  11/02/2013 FINDINGS: Normal heart size and mediastinal contours. No acute infiltrate or edema. No effusion or pneumothorax. No acute osseous findings. IMPRESSION: Negative chest. Electronically Signed   By: Monte Fantasia M.D.   On: 06/11/2015 10:01     ____________________________________________   PROCEDURES  Procedure(s) performed: None  Critical Care performed: No ____________________________________________   INITIAL IMPRESSION / ASSESSMENT AND PLAN / ED COURSE  Pertinent labs & imaging results that were available during my care of the patient were reviewed by me and considered in my medical decision making (see chart for details).  45 y.o. male with hypertension, hyperlipidemia and diabetes presenting with 1 month of multiple daily episodes of chest pain. It is possible that the patient has angina, but at this time, there is no evidence of acute ischemia or MI. His EKG does not show ischemic changes and his troponin is negative despite an onset of symptoms several weeks ago. He has not recently had any risk stratification studies I have spoken with Dr. Saralyn Pilar who will see him in the office for further evaluation. The patient does have some hyperglycemia but did not take his metformin today so  I have asked him to follow up with his regular doctor about this. I have sent a thyroid panel given his generalized weakness and his primary care doctor will follow this up as well. The patient understands return precautions as well as follow-up instructions.  ____________________________________________  FINAL CLINICAL IMPRESSION(S) / ED DIAGNOSES  Final diagnoses:  Chest pain, unspecified chest pain type  Generalized weakness  Hyperglycemia      NEW MEDICATIONS STARTED DURING THIS VISIT:  New Prescriptions   No medications on file     Eula Listen, MD 06/11/15 1110

## 2015-06-11 NOTE — ED Notes (Signed)
Patient states he has right sided chest pain starting a couple of weeks, describes as "tightness" that comes and goes.  Estimates that the pain occurs off and on throughout the day.  Here today because the pain is not going away.

## 2015-06-11 NOTE — ED Notes (Signed)
Lab called by RN and informed of TSH add on. Lab verbalized they will run the test as soon as possible.

## 2015-06-21 ENCOUNTER — Ambulatory Visit (INDEPENDENT_AMBULATORY_CARE_PROVIDER_SITE_OTHER): Payer: 59 | Admitting: Family Medicine

## 2015-06-21 ENCOUNTER — Encounter: Payer: Self-pay | Admitting: Family Medicine

## 2015-06-21 VITALS — BP 126/88 | HR 83 | Temp 97.8°F | Ht 74.0 in | Wt 236.6 lb

## 2015-06-21 DIAGNOSIS — R7989 Other specified abnormal findings of blood chemistry: Secondary | ICD-10-CM

## 2015-06-21 DIAGNOSIS — E1165 Type 2 diabetes mellitus with hyperglycemia: Secondary | ICD-10-CM

## 2015-06-21 DIAGNOSIS — R079 Chest pain, unspecified: Secondary | ICD-10-CM

## 2015-06-21 DIAGNOSIS — R809 Proteinuria, unspecified: Secondary | ICD-10-CM

## 2015-06-21 DIAGNOSIS — R946 Abnormal results of thyroid function studies: Secondary | ICD-10-CM | POA: Diagnosis not present

## 2015-06-21 DIAGNOSIS — R319 Hematuria, unspecified: Secondary | ICD-10-CM

## 2015-06-21 DIAGNOSIS — R3129 Other microscopic hematuria: Secondary | ICD-10-CM

## 2015-06-21 DIAGNOSIS — R109 Unspecified abdominal pain: Secondary | ICD-10-CM | POA: Diagnosis not present

## 2015-06-21 DIAGNOSIS — IMO0001 Reserved for inherently not codable concepts without codable children: Secondary | ICD-10-CM

## 2015-06-21 LAB — POCT URINALYSIS DIPSTICK
Bilirubin, UA: NEGATIVE
Glucose, UA: NEGATIVE
Ketones, UA: NEGATIVE
Leukocytes, UA: NEGATIVE
Nitrite, UA: NEGATIVE
PH UA: 5
SPEC GRAV UA: 1.025
UROBILINOGEN UA: 0.2

## 2015-06-21 LAB — URINALYSIS, MICROSCOPIC ONLY

## 2015-06-21 LAB — T3, FREE: T3, Free: 3.7 pg/mL (ref 2.3–4.2)

## 2015-06-21 LAB — T4: T4 TOTAL: 8.4 ug/dL (ref 4.5–12.0)

## 2015-06-21 NOTE — Patient Instructions (Addendum)
Nice to see you. We'll refer you to cardiology. Return to check your thyroid function today. We are also going to check her urine. Please continue taking the metformin and glipizide. If you develop chest pain, abdominal pain, nausea, vomiting, diarrhea, blood in her stool, blood in your urine, fevers, burning with urination, or any new or change in symptoms please seek medical attention.

## 2015-06-21 NOTE — Progress Notes (Signed)
Pre visit review using our clinic review tool, if applicable. No additional management support is needed unless otherwise documented below in the visit note. 

## 2015-06-22 LAB — URINE CULTURE
COLONY COUNT: NO GROWTH
ORGANISM ID, BACTERIA: NO GROWTH

## 2015-06-23 DIAGNOSIS — R7989 Other specified abnormal findings of blood chemistry: Secondary | ICD-10-CM | POA: Insufficient documentation

## 2015-06-23 DIAGNOSIS — R3129 Other microscopic hematuria: Secondary | ICD-10-CM | POA: Insufficient documentation

## 2015-06-23 DIAGNOSIS — R809 Proteinuria, unspecified: Secondary | ICD-10-CM | POA: Insufficient documentation

## 2015-06-23 NOTE — Assessment & Plan Note (Addendum)
Found on UA in the ED. Last microalbumin creatinine ratio was 209. Indicates fairly significant proteinuria. Concern would be for kidney dysfunction due to his diabetes or hypertension. He is already on lisinopril. We will repeat urine studies today. We will need to consider nephrology referral pending urine studies.

## 2015-06-23 NOTE — Assessment & Plan Note (Signed)
Found on lab work in ED. We'll check free T3 and T4.

## 2015-06-23 NOTE — Progress Notes (Signed)
Patient ID: Phillip Norman, male   DOB: August 31, 1970, 45 y.o.   MRN: [id number]  Phillip Rumps, MD Phone: [phone]  Phillip Norman is a 45 y.o. male who presents today for follow-up.  Patient was evaluated in the emergency room for chest pain after his last visit. He notes no further chest pain. No shortness of breath. He had a negative troponin the ED. He had a negative chest x-ray as well. It was advised that he follow-up with cardiology. He needs a referral to cardiology. He reports he has started taking over-the-counter reflux medication and feels this helped.  Additionally found that his TSH was elevated. He notes the last couple months he has been slightly more tired than usual. He notes he had a virus in December and chalked it up to that. He notes cold weather does bother him. No hair changes or skin changes. Possibly has a family history of thyroid issues.  Also noted on review of his UA that he had 6-30 RBCs and greater than 500 protein. Patient notes minimal dysuria intermittently. Also notes occasional intermittent flank discomfort that can occur in the left and the right and sometimes radiates down his left abdomen. He notes no frequency or urgency. He notes no pain in his penis. He notes no hematuria. No blood in his stool. Bowel movements most days. Notes the discomfort that he has had intermittently has been going on for a number of months. No prior cigarette use. Minimal alcohol intake.  DIABETES Disease Monitoring: Blood Sugar ranges-does not check Polyuria/phagia/dipsia- no      Medications: Compliance- taking metformin 1500 mg daily and glipizide. Patient notes he recently increased from 1000 to 1500 mg metformin Hypoglycemic symptoms- rare, described as feeling tired area eats and this improves.  PMH: nonsmoker.   ROS see history of present illness  Objective  Physical Exam Filed Vitals:   06/21/15 1434  BP: 126/88  Pulse: 83  Temp: 97.8 F (36.6 C)    BP  Readings from Last 3 Encounters:  06/21/15 126/88  06/11/15 147/97  06/11/15 136/98   Wt Readings from Last 3 Encounters:  06/21/15 236 lb 9.6 oz (107.321 kg)  06/11/15 235 lb (106.595 kg)  06/11/15 236 lb (107.049 kg)    Physical Exam  Constitutional: He is well-developed, well-nourished, and in no distress.  HENT:  Head: Normocephalic and atraumatic.  Right Ear: External ear normal.  Left Ear: External ear normal.  Mouth/Throat: Oropharynx is clear and moist.  Eyes: Conjunctivae are normal. Pupils are equal, round, and reactive to light.  Neck: Neck supple.  Cardiovascular: Normal rate, regular rhythm and normal heart sounds.  Exam reveals no gallop and no friction rub.   No murmur heard. Pulmonary/Chest: Effort normal and breath sounds normal. No respiratory distress. He has no wheezes. He has no rales.  Abdominal: Soft. Bowel sounds are normal. He exhibits no distension and no mass. There is tenderness (minimal left mid abdomen). There is no rebound and no guarding.  Lymphadenopathy:    He has no cervical adenopathy.  Neurological: He is alert. Gait normal.  Skin: Skin is warm and dry. He is not diaphoretic.     Assessment/Plan: Please see individual problem list.  Abdominal discomfort Continues to have this issue. No LFT abnormalities on lab work from the emergency room. Given blood in his urine and the ED we will repeat the urine and obtain a urine culture to rule out causes consistent with this. Blood could be consistent with kidney stone, though would  not expect that to cause abdominal tenderness or discomfort. Could be related to constipation, though he has bowel movements most days. No blood reported in his stool. He has no peritoneal signs. No other GI symptoms with this. Could be musculoskeletal. We'll await urine workup and then consider further imaging. Given return precautions.  Chest pain No further episodes of this. Negative troponin in the ED. Given that it has  improved with use of over-the-counter reflux medications this could be related to reflux. Patient does have significant risk factors for cardiac issues and thus we will refer to cardiology for further evaluation. The chest x-ray. Normal lung exam. Stable vitals. Given return precautions.  Diabetes mellitus type 2, uncontrolled, without complications (HCC) Last A1c was 8.1. He is not yet due for repeat. He has recently increased his metformin as he was previously advised. We will continue to monitor and check an A1c at his next visit.  Elevated TSH Found on lab work in ED. We'll check free T3 and T4.  Hematuria, microscopic Microscopic hematuria seen on UA in the emergency room. Notes intermittent dysuria. We'll check urine again today with UA and send for culture and micro-. If negative for infection would need to consider urology versus nephrology referral and ultrasound kidneys. Given return precautions.  Proteinuria Found on UA in the ED. Last microalbumin creatinine ratio was 209. Indicates fairly significant proteinuria. Concern would be for kidney dysfunction due to his diabetes or hypertension. He is already on lisinopril. We will repeat urine studies today. We will need to consider nephrology referral pending urine studies.    Orders Placed This Encounter  Procedures  . Urine Culture  . T3, free  . T4  . Urine Microscopic Only  . Ambulatory referral to Cardiology    Referral Priority:  Routine    Referral Type:  Consultation    Referral Reason:  Specialty Services Required    Requested Specialty:  Cardiology    Number of Visits Requested:  1  . POCT Urinalysis Dipstick    Phillip Norman

## 2015-06-23 NOTE — Assessment & Plan Note (Signed)
Microscopic hematuria seen on UA in the emergency room. Notes intermittent dysuria. We'll check urine again today with UA and send for culture and micro-. If negative for infection would need to consider urology versus nephrology referral and ultrasound kidneys. Given return precautions.

## 2015-06-23 NOTE — Assessment & Plan Note (Signed)
Continues to have this issue. No LFT abnormalities on lab work from the emergency room. Given blood in his urine and the ED we will repeat the urine and obtain a urine culture to rule out causes consistent with this. Blood could be consistent with kidney stone, though would not expect that to cause abdominal tenderness or discomfort. Could be related to constipation, though he has bowel movements most days. No blood reported in his stool. He has no peritoneal signs. No other GI symptoms with this. Could be musculoskeletal. We'll await urine workup and then consider further imaging. Given return precautions.

## 2015-06-23 NOTE — Assessment & Plan Note (Signed)
Last A1c was 8.1. He is not yet due for repeat. He has recently increased his metformin as he was previously advised. We will continue to monitor and check an A1c at his next visit.

## 2015-06-23 NOTE — Assessment & Plan Note (Addendum)
No further episodes of this. Negative troponin in the ED. Given that it has improved with use of over-the-counter reflux medications this could be related to reflux. Patient does have significant risk factors for cardiac issues and thus we will refer to cardiology for further evaluation. Negative chest x-ray. Normal lung exam. Stable vitals. Given return precautions.

## 2015-06-25 ENCOUNTER — Other Ambulatory Visit: Payer: Self-pay | Admitting: Family Medicine

## 2015-06-25 DIAGNOSIS — R809 Proteinuria, unspecified: Secondary | ICD-10-CM

## 2015-07-22 ENCOUNTER — Other Ambulatory Visit: Payer: Self-pay | Admitting: Family Medicine

## 2015-07-29 ENCOUNTER — Other Ambulatory Visit: Payer: Self-pay | Admitting: Family Medicine

## 2015-08-01 ENCOUNTER — Other Ambulatory Visit: Payer: Self-pay | Admitting: Nephrology

## 2015-08-01 DIAGNOSIS — R319 Hematuria, unspecified: Secondary | ICD-10-CM

## 2015-08-01 DIAGNOSIS — R809 Proteinuria, unspecified: Secondary | ICD-10-CM

## 2015-08-05 ENCOUNTER — Encounter: Payer: Self-pay | Admitting: Cardiology

## 2015-08-05 ENCOUNTER — Ambulatory Visit (INDEPENDENT_AMBULATORY_CARE_PROVIDER_SITE_OTHER): Payer: 59 | Admitting: Cardiology

## 2015-08-05 ENCOUNTER — Encounter (INDEPENDENT_AMBULATORY_CARE_PROVIDER_SITE_OTHER): Payer: Self-pay

## 2015-08-05 VITALS — BP 138/98 | HR 74 | Ht 74.5 in | Wt 234.0 lb

## 2015-08-05 DIAGNOSIS — R079 Chest pain, unspecified: Secondary | ICD-10-CM

## 2015-08-05 DIAGNOSIS — I10 Essential (primary) hypertension: Secondary | ICD-10-CM | POA: Diagnosis not present

## 2015-08-05 DIAGNOSIS — R9431 Abnormal electrocardiogram [ECG] [EKG]: Secondary | ICD-10-CM | POA: Diagnosis not present

## 2015-08-05 DIAGNOSIS — E785 Hyperlipidemia, unspecified: Secondary | ICD-10-CM | POA: Diagnosis not present

## 2015-08-05 NOTE — Patient Instructions (Addendum)
Medication Instructions:  Your physician recommends that you continue on your current medications as directed. Please refer to the Current Medication list given to you today.   Labwork: None ordered   Testing/Procedures: Your physician has requested that you have an echocardiogram. Echocardiography is a painless test that uses sound waves to create images of your heart. It provides your doctor with information about the size and shape of your heart and how well your heart's chambers and valves are working. This procedure takes approximately one hour. There are no restrictions for this procedure.  Date & Time:___________________________________________________________________  Your physician has requested that you have en exercise stress myoview. For further information please visit [email or web address]. Please follow instruction sheet, as given.  Date & Time: _________March 15, 2017 at 08:30 AM________________________________  Follow-Up: Your physician recommends that you schedule a follow-up appointment after testing to review results.   Date & Time:______________________________________________________________________   Any Other Special Instructions Will Be Listed Below (If Applicable).  [place]  Your caregiver has ordered a Stress Test with nuclear imaging. The purpose of this test is to evaluate the blood supply to your heart muscle. This procedure is referred to as a "Non-Invasive Stress Test." This is because other than having an IV started in your vein, nothing is inserted or "invades" your body. Cardiac stress tests are done to find areas of poor blood flow to the heart by determining the extent of coronary artery disease (CAD). Some patients exercise on a treadmill, which naturally increases the blood flow to your heart, while others who are  unable to walk on a treadmill due to physical limitations have a pharmacologic/chemical stress agent called Lexiscan . This medicine  will mimic walking on a treadmill by temporarily increasing your coronary blood flow.   Please note: these test may take anywhere between 2-4 hours to complete  PLEASE REPORT TO [place] AT THE FIRST DESK WILL DIRECT YOU WHERE TO GO  Date of Procedure:______March 15, 2017 at 08:30 AM___________  Arrival Time for Procedure:_______Register at 08:15 AM_____________  Instructions regarding medication:   __X_ : Hold diabetes medication morning of procedure  Hold your Glipizide and Glucophage the morning of your procedure.     PLEASE NOTIFY THE OFFICE AT LEAST 13 HOURS IN ADVANCE IF YOU ARE UNABLE TO KEEP YOUR APPOINTMENT.  [phone] AND  PLEASE NOTIFY NUCLEAR MEDICINE AT [facility] AT LEAST 24 HOURS IN ADVANCE IF YOU ARE UNABLE TO KEEP YOUR APPOINTMENT. [phone]  How to prepare for your Myoview test:  1. Do not eat or drink after midnight 2. No caffeine for 24 hours prior to test 3. No smoking 24 hours prior to test. 4. Your medication may be taken with water.  If your doctor stopped a medication because of this test, do not take that medication. 5. Ladies, please do not wear dresses.  Skirts or pants are appropriate. Please wear a short sleeve shirt. 6. No perfume, cologne or lotion. 7. Wear comfortable walking shoes. No heels!             If you need a refill on your cardiac medications before your next appointment, please call your pharmacy.  Echocardiogram An echocardiogram, or echocardiography, uses sound waves (ultrasound) to produce an image of your heart. The echocardiogram is simple, painless, obtained within a short period of time, and offers valuable information to your health care provider. The images from an echocardiogram can provide information such as:  Evidence of coronary artery disease (CAD).  Heart size.  Heart muscle function.  Heart valve function.  Aneurysm detection.  Evidence of a past heart  attack.  Fluid buildup around the heart.  Heart muscle thickening.  Assess heart valve function. LET [facility] CARE PROVIDER KNOW ABOUT:  Any allergies you have.  All medicines you are taking, including vitamins, herbs, eye drops, creams, and over-the-counter medicines.  Previous problems you or members of your family have had with the use of anesthetics.  Any blood disorders you have.  Previous surgeries you have had.  Medical conditions you have.  Possibility of pregnancy, if this applies. BEFORE THE PROCEDURE  No special preparation is needed. Eat and drink normally.  PROCEDURE  8. In order to produce an image of your heart, gel will be applied to your chest and a wand-like tool (transducer) will be moved over your chest. The gel will help transmit the sound waves from the transducer. The sound waves will harmlessly bounce off your heart to allow the heart images to be captured in real-time motion. These images will then be recorded. 9. You may need an IV to receive a medicine that improves the quality of the pictures. AFTER THE PROCEDURE You may return to your normal schedule including diet, activities, and medicines, unless your health care provider tells you otherwise.   This information is not intended to replace advice given to you by your health care provider. Make sure you discuss any questions you have with your health care provider.   Document Released: 05/08/2000 Document Revised: 06/01/2014 Document Reviewed: 01/16/2013 Elsevier Interactive Patient Education 2016 [employer].   Exercise Stress Electrocardiogram An exercise stress electrocardiogram is a test that is done to evaluate the blood supply to your heart. This test may also be called exercise stress electrocardiography. The test is done while you are walking on a treadmill. The goal of this test is to raise your heart rate. This test is done to find areas of poor blood flow to the heart by determining  the extent of coronary artery disease (CAD).   CAD is defined as narrowing in one or more heart (coronary) arteries of more than 70%. If you have an abnormal test result, this may mean that you are not getting adequate blood flow to your heart during exercise. Additional testing may be needed to understand why your test was abnormal. LET [facility] CARE PROVIDER KNOW ABOUT:   Any allergies you have.  All medicines you are taking, including vitamins, herbs, eye drops, creams, and over-the-counter medicines.  Previous problems you or members of your family have had with the use of anesthetics.  Any blood disorders you have.  Previous surgeries you have had.  Medical conditions you have.  Possibility of pregnancy, if this applies. RISKS AND COMPLICATIONS Generally, this is a safe procedure. However, as with any procedure, complications can occur. Possible complications can include:  Pain or pressure in the following areas:  Chest.  Jaw or neck.  Between your shoulder blades.  Radiating down your left arm.  Dizziness or light-headedness.  Shortness of breath.  Increased or irregular heartbeats.  Nausea or vomiting.  Heart attack (rare). BEFORE THE PROCEDURE 10. Avoid all forms of caffeine 24 hours before your test or as directed by your health care provider. This includes coffee, tea (even decaffeinated tea), caffeinated sodas, chocolate, cocoa, and certain pain medicines. 11. Follow your health care provider's instructions regarding eating and drinking before the test. 12. Take your medicines as directed at regular times with  water unless instructed otherwise. Exceptions may include: 1. If you have diabetes, ask how you are to take your insulin or pills. It is common to adjust insulin dosing the morning of the test. 2. If you are taking beta-blocker medicines, it is important to talk to your health care provider about these medicines well before the date of your test. Taking  beta-blocker medicines may interfere with the test. In some cases, these medicines need to be changed or stopped 24 hours or more before the test. 3. If you wear a nitroglycerin patch, it may need to be removed prior to the test. Ask your health care provider if the patch should be removed before the test. 13. If you use an inhaler for any breathing condition, bring it with you to the test. 14. If you are an outpatient, bring a snack so you can eat right after the stress phase of the test. 15. Do not smoke for 4 hours prior to the test or as directed by your health care provider. 16. Do not apply lotions, powders, creams, or oils on your chest prior to the test. 17. Wear loose-fitting clothes and comfortable shoes for the test. This test involves walking on a treadmill. PROCEDURE  Multiple patches (electrodes) will be put on your chest. If needed, small areas of your chest may have to be shaved to get better contact with the electrodes. Once the electrodes are attached to your body, multiple wires will be attached to the electrodes and your heart rate will be monitored.  Your heart will be monitored both at rest and while exercising.  You will walk on a treadmill. The treadmill will be started at a slow pace. The treadmill speed and incline will gradually be increased to raise your heart rate. AFTER THE PROCEDURE  Your heart rate and blood pressure will be monitored after the test.  You may return to your normal schedule including diet, activities, and medicines, unless your health care provider tells you otherwise.   This information is not intended to replace advice given to you by your health care provider. Make sure you discuss any questions you have with your health care provider.   Document Released: 05/08/2000 Document Revised: 05/16/2013 Document Reviewed: 01/16/2013 Elsevier Interactive Patient Education [employer].

## 2015-08-05 NOTE — Progress Notes (Signed)
Cardiology Office Note   Date:  08/05/2015   ID:  Phillip Norman, DOB June 19, 1970, MRN [id number]  Referring Doctor:  [name], MD   Cardiologist:   Wende Bushy, MD   Reason for consultation:  Chief Complaint  Patient presents with  . other    ED 1/17 due to chest pain. Meds reviewed verbally with pt.      History of Present Illness: Phillip Norman is a 45 y.o. male who presents for Chest pain.  Back in January 2017, patient had one episode of chest pain. This was a dull pain in the center of the chest, 5 out of 10 severity, nonradiating, on and off throughout approximately 2 days. There was associated lightheadedness and shortness of breath. Patient does not recall association with exertion. He thought that it could've been reflux. No recurrence since then.  Patient denies shortness of breath with exertion, however he is not physically active. No headache, cough, colds, abdominal pain, orthopnea, any, pedal edema.  In terms of hypertension, patient does not regularly check his blood pressure. He says it's probably in the range of 135/88-91.  In terms of hyperlipidemia, he is aware of elevated levels from November of last year. He wanted to try lifestyle, diet changes first. He has lost some weight but would like to lose some more.   ROS:  Please see the history of present illness. Aside from mentioned under HPI, all other systems are reviewed and negative.     Past Medical History  Diagnosis Date  . Hypertension   . Chronic back pain   . Diabetes mellitus without complication ([place])   . Hyperlipidemia     Past Surgical History  Procedure Laterality Date  . Tonsillectomy and adenoidectomy       reports that he has never smoked. He has never used smokeless tobacco. He reports that he does not drink alcohol or use illicit drugs.   family history includes Diabetes in his father; Hypertension in his mother. Diabetes in father and father's side. Hypertension mother  and mother's side. No history of coronary artery disease or sudden cardiac death.  Current Outpatient Prescriptions  Medication Sig Dispense Refill  . glipiZIDE (GLUCOTROL XL) 10 MG 24 hr tablet TAKE 1 TABLET BY MOUTH DAILY WITH BREAKFAST 90 tablet 3  . lisinopril (PRINIVIL,ZESTRIL) 20 MG tablet TAKE 1 TABLET (20 MG TOTAL) BY MOUTH DAILY. 30 tablet 4  . metFORMIN (GLUCOPHAGE-XR) 500 MG 24 hr tablet TAKE 3 TABLETS (1,500 MG TOTAL) BY MOUTH DAILY WITH BREAKFAST. 90 tablet 2   No current facility-administered medications for this visit.    Allergies: Review of patient's allergies indicates no known allergies.    PHYSICAL EXAM: VS:  BP 138/98 mmHg  Pulse 74  Ht 6' 2.5" (1.892 m)  Wt 234 lb (106.142 kg)  BMI 29.65 kg/m2 , Body mass index is 29.65 kg/(m^2). Wt Readings from Last 3 Encounters:  08/05/15 234 lb (106.142 kg)  06/21/15 236 lb 9.6 oz (107.321 kg)  06/11/15 235 lb (106.595 kg)    GENERAL:  well developed, well nourished,Overweight, not in acute distress HEENT: normocephalic, pink conjunctivae, anicteric sclerae, no xanthelasma, normal dentition, oropharynx clear NECK:  no neck vein engorgement, JVP normal, no hepatojugular reflux, carotid upstroke brisk and symmetric, no bruit, no thyromegaly, no lymphadenopathy LUNGS:  good respiratory effort, clear to auscultation bilaterally CV:  PMI not displaced, no thrills, no lifts, S1 and S2 within normal limits, no palpable S3 or S4, no murmurs, no rubs, no  gallops ABD:  Soft, nontender, nondistended, normoactive bowel sounds, no abdominal aortic bruit, no hepatomegaly, no splenomegaly MS: nontender back, no kyphosis, no scoliosis, no joint deformities EXT:  2+ DP/PT pulses, no edema, no varicosities, no cyanosis, no clubbing SKIN: warm, nondiaphoretic, normal turgor, no ulcers NEUROPSYCH: alert, oriented to person, place, and time, sensory/motor grossly intact, normal mood, appropriate affect  Recent Labs: 06/11/2015: ALT 18; B  Natriuretic Peptide 56.0; BUN 10; Creatinine, Ser 0.94; Hemoglobin 13.1; Magnesium 2.0; Platelets 232; Potassium 3.7; Sodium 138; TSH 5.467*   Lipid Panel November 2016    Component Value Date/Time   CHOL 295* 04/16/2015 0925   TRIG 151.0* 04/16/2015 0925   TRIG 147 04/19/2006 1403   HDL 60.40 04/16/2015 0925   CHOLHDL 5 04/16/2015 0925   CHOLHDL 4.8 CALC 04/19/2006 1403   VLDL 30.2 04/16/2015 0925   LDLCALC 205* 04/16/2015 0925   LDLDIRECT 188.2 02/08/2014 1705   LDLDIRECT 141.2 04/19/2006 1403     Other studies Reviewed:  EKG:  EKG is ordered today. The ekg ordered today was personally reviewed by me and it reveals 08/05/2015 sinus rhythm, 74 BPM. T wave inversion in the lateral leads. Possible ischemia.   Additional studies/ records that were reviewed personally reviewed by me today include:  None available   ASSESSMENT AND PLAN:  Chest pain Abnormal EKG Patient had the episode back in general 2017. No recurrence since. He has significant risk factors for coronary artery disease including hypertension, diabetes, hyperlipidemia. Recommend further evaluation with echocardiogram and exercise nuclear stress test.  Hypertension Recommend to continue to monitor. Blood pressure log. Is less than 140/80. Continue current medications for now. Continue lifestyle changes.  Hyperlipidemia LDL goal is less than 70 due to history of diabetes. Patient wanted to do lifestyle changes first. PCP monitoring fasting lipid levels.  Current medicines are reviewed at length with the patient today.  The patient does not have concerns regarding medicines.  Labs/ tests ordered today include:  Orders Placed This Encounter  Procedures  . NM Myocar Multi W/Spect W/Wall Motion / EF  . EKG 12-Lead  . ECHOCARDIOGRAM COMPLETE    I had a lengthy and detailed discussion with the patient regarding diagnoses, prognosis, diagnostic options, treatment options.  I counseled the patient on importance of  lifestyle modification including heart healthy diet, regular physical activity.  Disposition:   FU with undersigned after tests  Signed, Wende Bushy, MD  08/05/2015 10:26 AM    [facility]     Review

## 2015-08-07 ENCOUNTER — Other Ambulatory Visit: Payer: Self-pay

## 2015-08-07 ENCOUNTER — Ambulatory Visit
Admission: RE | Admit: 2015-08-07 | Discharge: 2015-08-07 | Disposition: A | Discharge status: Home / Self Care | Payer: 59 | Source: Ambulatory Visit | Attending: Cardiology | Admitting: Cardiology

## 2015-08-07 ENCOUNTER — Ambulatory Visit (INDEPENDENT_AMBULATORY_CARE_PROVIDER_SITE_OTHER): Payer: 59

## 2015-08-07 ENCOUNTER — Ambulatory Visit
Admission: RE | Admit: 2015-08-07 | Discharge: 2015-08-07 | Disposition: A | Discharge status: Home / Self Care | Payer: 59 | Source: Ambulatory Visit | Attending: Nephrology | Admitting: Nephrology

## 2015-08-07 DIAGNOSIS — R319 Hematuria, unspecified: Secondary | ICD-10-CM | POA: Diagnosis not present

## 2015-08-07 DIAGNOSIS — R809 Proteinuria, unspecified: Secondary | ICD-10-CM | POA: Insufficient documentation

## 2015-08-07 DIAGNOSIS — R079 Chest pain, unspecified: Secondary | ICD-10-CM

## 2015-08-07 DIAGNOSIS — R9431 Abnormal electrocardiogram [ECG] [EKG]: Secondary | ICD-10-CM

## 2015-08-08 ENCOUNTER — Ambulatory Visit
Admission: RE | Admit: 2015-08-08 | Discharge: 2015-08-08 | Disposition: A | Discharge status: Home / Self Care | Payer: 59 | Source: Ambulatory Visit | Attending: Cardiology | Admitting: Cardiology

## 2015-08-08 DIAGNOSIS — R9431 Abnormal electrocardiogram [ECG] [EKG]: Secondary | ICD-10-CM | POA: Insufficient documentation

## 2015-08-08 DIAGNOSIS — R079 Chest pain, unspecified: Secondary | ICD-10-CM | POA: Diagnosis not present

## 2015-08-08 MED ORDER — TECHNETIUM TC 99M SESTAMIBI - CARDIOLITE
13.7700 | Freq: Once | INTRAVENOUS | Status: AC | PRN
  Administered 2015-08-08: 13.77 via INTRAVENOUS

## 2015-08-08 MED ORDER — TECHNETIUM TC 99M SESTAMIBI - CARDIOLITE
32.7300 | Freq: Once | INTRAVENOUS | Status: AC | PRN
  Administered 2015-08-08: 32.73 via INTRAVENOUS

## 2015-08-09 LAB — NM MYOCAR MULTI W/SPECT W/WALL MOTION / EF
CHL CUP MPHR: 175 {beats}/min
CHL CUP NUCLEAR SDS: 0
CHL CUP NUCLEAR SRS: 0
CHL CUP RESTING HR STRESS: 71 {beats}/min
CSEPEDS: 30 s
CSEPHR: 82 %
Estimated workload: 7 METS
Exercise duration (min): 7 min
LV sys vol: 36 mL
LVDIAVOL: 94 mL (ref 62–150)
Peak HR: 144 {beats}/min
SSS: 0
TID: 0.89

## 2015-08-20 ENCOUNTER — Encounter: Payer: Self-pay | Admitting: Cardiology

## 2015-08-20 ENCOUNTER — Ambulatory Visit (INDEPENDENT_AMBULATORY_CARE_PROVIDER_SITE_OTHER): Payer: 59 | Admitting: Cardiology

## 2015-08-20 ENCOUNTER — Encounter (INDEPENDENT_AMBULATORY_CARE_PROVIDER_SITE_OTHER): Payer: Self-pay

## 2015-08-20 VITALS — BP 140/110 | HR 70 | Ht 74.5 in | Wt 234.0 lb

## 2015-08-20 DIAGNOSIS — I10 Essential (primary) hypertension: Secondary | ICD-10-CM

## 2015-08-20 DIAGNOSIS — E785 Hyperlipidemia, unspecified: Secondary | ICD-10-CM | POA: Diagnosis not present

## 2015-08-20 DIAGNOSIS — R079 Chest pain, unspecified: Secondary | ICD-10-CM | POA: Diagnosis not present

## 2015-08-20 MED ORDER — LISINOPRIL 40 MG PO TABS: 40.0000 mg | ORAL_TABLET | Freq: Every day | ORAL | Status: DC

## 2015-08-20 NOTE — Patient Instructions (Signed)
Medication Instructions:  Your physician has recommended you make the following change in your medication: Increase Lisinopril to 40 mg Once Daily   Labwork: Your physician recommends that you return for lab work in: 1 week to have BMP level drawn.  Date & Time:__________________________________________________________   Testing/Procedures: None Ordered  Follow-Up: Your physician wants you to follow-up in: 6 months with Dr. Yvone Neu. You will receive a reminder letter in the mail two months in advance. If you don't receive a letter, please call our office to schedule the follow-up appointment.   Any Other Special Instructions Will Be Listed Below (If Applicable).     If you need a refill on your cardiac medications before your next appointment, please call your pharmacy.

## 2015-08-20 NOTE — Progress Notes (Signed)
Cardiology Office Note   Date:  08/20/2015   ID:  Phillip Norman, DOB 09/24/1970, MRN [id number]  Referring Doctor:  [name], MD   Cardiologist:   Wende Bushy, MD   Reason for consultation:  Chief Complaint  Patient presents with  . Follow-up    no chest pain, sob or swelling      History of Present Illness: Phillip Norman is a 45 y.o. male who presents for Follow-up for chest pain, after stress test.  Patient reports no recurrence of chest pain.Patient denies shortness of breath with exertion, No headache, cough, colds, abdominal pain, orthopnea, any, pedal edema.  In terms of hypertension, patient does not regularly check his blood pressure. He knows it is probably high due to increased stress at work.  In terms of hyperlipidemia, he is aware of elevated levels from November of last year. He wanted to try lifestyle, diet changes first. He follows up with his PCP for this.  ROS:  Please see the history of present illness. Aside from mentioned under HPI, all other systems are reviewed and negative.     Past Medical History  Diagnosis Date  . Hypertension   . Chronic back pain   . Diabetes mellitus without complication ([place])   . Hyperlipidemia     Past Surgical History  Procedure Laterality Date  . Tonsillectomy and adenoidectomy       reports that he has never smoked. He has never used smokeless tobacco. He reports that he does not drink alcohol or use illicit drugs.   family history includes Diabetes in his father; Hypertension in his mother. Diabetes in father and father's side. Hypertension mother and mother's side. No history of coronary artery disease or sudden cardiac death.  Current Outpatient Prescriptions  Medication Sig Dispense Refill  . glipiZIDE (GLUCOTROL XL) 10 MG 24 hr tablet TAKE 1 TABLET BY MOUTH DAILY WITH BREAKFAST 90 tablet 3  . metFORMIN (GLUCOPHAGE-XR) 500 MG 24 hr tablet TAKE 3 TABLETS (1,500 MG TOTAL) BY MOUTH DAILY WITH BREAKFAST.  90 tablet 2  . lisinopril (PRINIVIL,ZESTRIL) 40 MG tablet Take 1 tablet (40 mg total) by mouth daily. 30 tablet 6   No current facility-administered medications for this visit.    Allergies: Review of patient's allergies indicates no known allergies.    PHYSICAL EXAM: VS:  BP 140/110 mmHg  Pulse 70  Ht 6' 2.5" (1.892 m)  Wt 234 lb (106.142 kg)  BMI 29.65 kg/m2 , Body mass index is 29.65 kg/(m^2). Wt Readings from Last 3 Encounters:  08/20/15 234 lb (106.142 kg)  08/05/15 234 lb (106.142 kg)  06/21/15 236 lb 9.6 oz (107.321 kg)    GENERAL:  well developed, well nourished,Overweight, not in acute distress HEENT: normocephalic, pink conjunctivae, anicteric sclerae, no xanthelasma, normal dentition, oropharynx clear NECK:  no neck vein engorgement, JVP normal, no hepatojugular reflux, carotid upstroke brisk and symmetric, no bruit, no thyromegaly, no lymphadenopathy LUNGS:  good respiratory effort, clear to auscultation bilaterally CV:  PMI not displaced, no thrills, no lifts, S1 and S2 within normal limits, no palpable S3 or S4, no murmurs, no rubs, no gallops ABD:  Soft, nontender, nondistended, normoactive bowel sounds, no abdominal aortic bruit, no hepatomegaly, no splenomegaly MS: nontender back, no kyphosis, no scoliosis, no joint deformities EXT:  2+ DP/PT pulses, no edema, no varicosities, no cyanosis, no clubbing SKIN: warm, nondiaphoretic, normal turgor, no ulcers NEUROPSYCH: alert, oriented to person, place, and time, sensory/motor grossly intact, normal mood, appropriate affect  Recent  Labs: 06/11/2015: ALT 18; B Natriuretic Peptide 56.0; BUN 10; Creatinine, Ser 0.94; Hemoglobin 13.1; Magnesium 2.0; Platelets 232; Potassium 3.7; Sodium 138; TSH 5.467*   Lipid Panel November 2016    Component Value Date/Time   CHOL 295* 04/16/2015 0925   TRIG 151.0* 04/16/2015 0925   TRIG 147 04/19/2006 1403   HDL 60.40 04/16/2015 0925   CHOLHDL 5 04/16/2015 0925   CHOLHDL 4.8 CALC  04/19/2006 1403   VLDL 30.2 04/16/2015 0925   LDLCALC 205* 04/16/2015 0925   LDLDIRECT 188.2 02/08/2014 1705   LDLDIRECT 141.2 04/19/2006 1403     Other studies Reviewed:  EKG:   The ekg from 08/05/2015  was personally reviewed by me and it reveals  sinus rhythm, 74 BPM. T wave inversion in the lateral leads. Possible ischemia.   Additional studies/ records that were reviewed personally reviewed by me today include:   Exercise nuclear stress test 08/09/2015: Exercise myocardial perfusion imaging study with no significant ischemia Normal wall motion, EF estimated at 46% No EKG changes concerning for ischemia at peak stress or in recovery. Hypertensive response with exercise, peak systolic pressure [id number] Suboptimal target heart rate 82% of maximum, 144 bpm achieved, 7:30 min, 7 METS Low risk scan  Echocardiogram 08/07/2015: Left ventricle: The cavity size was normal. Wall thickness was  normal. Systolic function was normal. The estimated ejection  fraction was in the range of 60% to 65%. Wall motion was normal;  there were no regional wall motion abnormalities. Left  ventricular diastolic function parameters were normal.   ASSESSMENT AND PLAN:  Chest pain Abnormal EKG He has significant risk factors for coronary artery disease including hypertension, diabetes, hyperlipidemia.  Has had no recurrence of symptoms. Exercise nuclear stress is from 08/09/2015 did not reveal ischemia. Although it was submaximal, he exercised for 7-1/2 minutes and reached 82% of maximum predicted heart rate, 7 METs. Overall this was a low risk scan. Details were discussed at length with the patient and the patient's wife. Patient reassured. Patient verbalized understanding. Patient is aware that he needs to report any recurrences or new symptoms.   Hypertension Recommend to up titrate dose or add Medication for blood pressure. Patient preferred to increased dose of current medication, lisinopril.  Increase to 40 mg by mouth daily, recheck BMP in 1 week. Recommend to continue to monitor. Blood pressure log is less than 140/80. Continue current medications for now. Continue lifestyle changes. Patient follows up with PCP. May follow-up with our office sooner than 6 months if needed.  Hyperlipidemia LDL goal is less than 70 due to history of diabetes. Patient wanted to do lifestyle changes first. PCP monitoring fasting lipid levels.  Current medicines are reviewed at length with the patient today.  The patient does not have concerns regarding medicines.  Labs/ tests ordered today include:  Orders Placed This Encounter  Procedures  . Basic Metabolic Panel (BMET)    I had a lengthy and detailed discussion with the patient regarding diagnoses, prognosis, diagnostic options, treatment options.  I counseled the patient on importance of lifestyle modification including heart healthy diet, regular physical activity.  Disposition:   FU with undersigned in 6 months or sooner if with symptoms   Signed, Wende Bushy, MD  08/20/2015 12:27 PM    [place]

## 2015-08-27 ENCOUNTER — Other Ambulatory Visit (INDEPENDENT_AMBULATORY_CARE_PROVIDER_SITE_OTHER): Payer: 59

## 2015-08-27 DIAGNOSIS — I10 Essential (primary) hypertension: Secondary | ICD-10-CM

## 2015-08-28 LAB — BASIC METABOLIC PANEL
BUN/Creatinine Ratio: 11 (ref 9–20)
BUN: 10 mg/dL (ref 6–24)
CALCIUM: 9 mg/dL (ref 8.7–10.2)
CHLORIDE: 101 mmol/L (ref 96–106)
CO2: 23 mmol/L (ref 18–29)
Creatinine, Ser: 0.92 mg/dL (ref 0.76–1.27)
GFR calc Af Amer: 116 mL/min/{1.73_m2} (ref >59)
GFR calc non Af Amer: 100 mL/min/{1.73_m2} (ref >59)
Glucose: 173 mg/dL — ABNORMAL HIGH (ref 65–99)
POTASSIUM: 4.9 mmol/L (ref 3.5–5.2)
SODIUM: 140 mmol/L (ref 134–144)

## 2015-09-02 ENCOUNTER — Telehealth: Payer: Self-pay | Admitting: Surgical

## 2015-09-02 NOTE — Telephone Encounter (Signed)
LM for patient to return call. He should schedule a follow up appointment for his diabetes.

## 2015-09-03 ENCOUNTER — Encounter: Payer: Self-pay | Admitting: Surgical

## 2015-10-27 ENCOUNTER — Other Ambulatory Visit: Payer: Self-pay | Admitting: Family Medicine

## 2016-02-14 ENCOUNTER — Other Ambulatory Visit: Payer: Self-pay | Admitting: Family Medicine

## 2016-02-14 NOTE — Telephone Encounter (Signed)
Refill sent in. Patient needs a follow-up appointment scheduled.

## 2016-02-14 NOTE — Telephone Encounter (Signed)
No A1c on file in chart since 04/16/2015 ok to fill Metformin?

## 2016-02-19 NOTE — Telephone Encounter (Signed)
Patient notified to call office and schedule follow up.

## 2016-03-24 ENCOUNTER — Other Ambulatory Visit: Payer: Self-pay | Admitting: Cardiology

## 2016-03-24 DIAGNOSIS — I10 Essential (primary) hypertension: Secondary | ICD-10-CM

## 2016-05-04 ENCOUNTER — Other Ambulatory Visit: Payer: Self-pay | Admitting: Family Medicine

## 2016-06-16 ENCOUNTER — Other Ambulatory Visit: Payer: Self-pay | Admitting: Family Medicine

## 2016-06-16 ENCOUNTER — Telehealth: Payer: Self-pay

## 2016-06-16 ENCOUNTER — Ambulatory Visit (INDEPENDENT_AMBULATORY_CARE_PROVIDER_SITE_OTHER): Payer: 59 | Admitting: Family Medicine

## 2016-06-16 ENCOUNTER — Encounter: Payer: Self-pay | Admitting: Family Medicine

## 2016-06-16 ENCOUNTER — Ambulatory Visit
Admission: RE | Admit: 2016-06-16 | Discharge: 2016-06-16 | Disposition: A | Discharge status: Home / Self Care | Payer: 59 | Source: Ambulatory Visit | Attending: Family Medicine | Admitting: Family Medicine

## 2016-06-16 VITALS — BP 148/98 | HR 76 | Temp 98.4°F | Wt 234.0 lb

## 2016-06-16 DIAGNOSIS — R109 Unspecified abdominal pain: Secondary | ICD-10-CM | POA: Diagnosis not present

## 2016-06-16 DIAGNOSIS — Z5181 Encounter for therapeutic drug level monitoring: Secondary | ICD-10-CM | POA: Diagnosis not present

## 2016-06-16 DIAGNOSIS — I10 Essential (primary) hypertension: Secondary | ICD-10-CM

## 2016-06-16 DIAGNOSIS — R1084 Generalized abdominal pain: Secondary | ICD-10-CM | POA: Diagnosis not present

## 2016-06-16 DIAGNOSIS — R110 Nausea: Secondary | ICD-10-CM | POA: Diagnosis not present

## 2016-06-16 LAB — POCT URINALYSIS DIPSTICK
Bilirubin, UA: NEGATIVE
Glucose, UA: 100
Ketones, UA: NEGATIVE
Leukocytes, UA: NEGATIVE
NITRITE UA: NEGATIVE
Protein, UA: >=300
UROBILINOGEN UA: 0.2
pH, UA: 5

## 2016-06-16 LAB — CBC
HCT: 41 % (ref 39.0–52.0)
Hemoglobin: 13.3 g/dL (ref 13.0–17.0)
MCHC: 32.5 g/dL (ref 30.0–36.0)
MCV: 85.4 fl (ref 78.0–100.0)
PLATELETS: 258 10*3/uL (ref 150.0–400.0)
RBC: 4.8 Mil/uL (ref 4.22–5.81)
RDW: 13.9 % (ref 11.5–15.5)
WBC: 7.3 10*3/uL (ref 4.0–10.5)

## 2016-06-16 LAB — COMPREHENSIVE METABOLIC PANEL
ALK PHOS: 58 U/L (ref 39–117)
ALT: 12 U/L (ref 0–53)
AST: 13 U/L (ref 0–37)
Albumin: 4 g/dL (ref 3.5–5.2)
BUN: 12 mg/dL (ref 6–23)
CHLORIDE: 101 meq/L (ref 96–112)
CO2: 29 mEq/L (ref 19–32)
Calcium: 9.4 mg/dL (ref 8.4–10.5)
Creatinine, Ser: 1.09 mg/dL (ref 0.40–1.50)
GFR: 93.65 mL/min (ref >60.00)
GLUCOSE: 234 mg/dL — AB (ref 70–99)
POTASSIUM: 3.9 meq/L (ref 3.5–5.1)
SODIUM: 137 meq/L (ref 135–145)
TOTAL PROTEIN: 6.9 g/dL (ref 6.0–8.3)
Total Bilirubin: 0.4 mg/dL (ref 0.2–1.2)

## 2016-06-16 LAB — URINALYSIS, MICROSCOPIC ONLY: RBC / HPF: NONE SEEN (ref 0–?)

## 2016-06-16 LAB — POCT I-STAT CREATININE: CREATININE: 1 mg/dL (ref 0.61–1.24)

## 2016-06-16 MED ORDER — PANTOPRAZOLE SODIUM 40 MG PO TBEC: 40.0000 mg | DELAYED_RELEASE_TABLET | Freq: Every day | ORAL | 3 refills | Status: DC

## 2016-06-16 MED ORDER — IOPAMIDOL (ISOVUE-300) INJECTION 61%
100.0000 mL | Freq: Once | INTRAVENOUS | Status: AC | PRN
  Administered 2016-06-16: 100 mL via INTRAVENOUS

## 2016-06-16 MED ORDER — GLIPIZIDE ER 10 MG PO TB24: 10.0000 mg | ORAL_TABLET | Freq: Every day | ORAL | 1 refills | Status: DC

## 2016-06-16 MED ORDER — AMLODIPINE BESYLATE 5 MG PO TABS: 5.0000 mg | ORAL_TABLET | Freq: Every day | ORAL | 3 refills | Status: DC

## 2016-06-16 NOTE — Progress Notes (Signed)
[name], MD Phone: [phone]  Phillip Norman is a 46 y.o. male who presents today for same-day visit.  Patient presents with lower and upper abdominal pain for the last week. Feels like it's a heaviness in his stomach with some gaseous sensation. Notes no diarrhea or vomiting though has had some nausea. Did have some reflux yesterday with some burning in his lower chest that lasted for 5 seconds while he was laying down and improved on its own. Did not radiate anywhere. No other associated symptoms with it. No blood in his stool. Does have some dysuria and some urinary urgency though these are not new and not occurring today. No frequency of urination. He still has his gallbladder and his appendix.  Patient's blood pressure is also elevated today. Notes he's been taking his lisinopril. He notes his blood pressure typically runs 160/100 home. He notes no chest pain or shortness of breath. No edema. No headaches or vision changes.  PMH: nonsmoker.   ROS see history of present illness  Objective  Physical Exam Vitals:   06/16/16 0832 06/16/16 0850  BP: (!) 170/120 (!) 148/98  Pulse: 76   Temp: 98.4 F (36.9 C)     BP Readings from Last 3 Encounters:  06/16/16 (!) 148/98  08/20/15 (!) 140/110  08/05/15 (!) 138/98   Wt Readings from Last 3 Encounters:  06/16/16 234 lb (106.1 kg)  08/20/15 234 lb (106.1 kg)  08/05/15 234 lb (106.1 kg)    Physical Exam  Constitutional: No distress.  HENT:  Head: Normocephalic and atraumatic.  Mouth/Throat: Oropharynx is clear and moist.  Eyes: Conjunctivae are normal. Pupils are equal, round, and reactive to light.  Cardiovascular: Normal rate, regular rhythm and normal heart sounds.   Pulmonary/Chest: Effort normal and breath sounds normal.  Abdominal: Soft. Bowel sounds are normal. He exhibits no distension. There is tenderness (moderate diffuse tenderness). There is guarding (Mild intermittent diffuse guarding). There is no rebound.   Musculoskeletal: He exhibits no edema.  Neurological: He is alert. Gait normal.  Skin: Skin is warm and dry. He is not diaphoretic.     Assessment/Plan: Please see individual problem list.  Essential hypertension Improved on recheck though still not at goal. Has been elevated at home when he does check. We will add amlodipine. He will continue his lisinopril. He is given return precautions. He'll have lab work done today as outlined below. He will return in 1 week for recheck of blood pressure.  Abdominal discomfort New onset diffuse abdominal discomfort. Has been present for the last week. Tender throughout his abdomen with mild intermittent guarding. The differential is broad at this time. We will obtain lab work as outlined below. We'll obtain a CT scan abdomen and pelvis to evaluate for potential causes. Once the CT scan and the lab work returns we will determine the next step in management. He is given return precautions.   Orders Placed This Encounter  Procedures  . CT Abdomen Pelvis W Contrast    Last Meal: last night    Standing Status:   Future    Number of Occurrences:   1    Standing Expiration Date:   09/14/2017    Order Specific Question:   If indicated for the ordered procedure, I authorize the administration of contrast media per Radiology protocol    Answer:   Yes    Order Specific Question:   Reason for Exam (SYMPTOM  OR DIAGNOSIS REQUIRED)    Answer:   diffuse abdominal pain  with intermittent gurading, nausea, 1 week    Order Specific Question:   Preferred imaging location?    Answer:   [facility]    Order Specific Question:   Call Results- Best Contact Number?    Answer:   [phone] please hold patient   . Comp Met (CMET)  . CBC  . Creatinine    Standing Status:   Future    Standing Expiration Date:   06/16/2017  . POCT Urinalysis Dipstick    Meds ordered this encounter  Medications  . amLODipine (NORVASC) 5 MG tablet    Sig: Take 1 tablet (5 mg  total) by mouth daily.    Dispense:  90 tablet    Refill:  [place], MD [place]

## 2016-06-16 NOTE — Telephone Encounter (Signed)
-----   Message from Leone Haven, MD sent at 06/16/2016 12:31 PM EST ----- Please let the patient know that his CT scan did not reveal acute changes to cause his symptoms. His lab work was overall reassuring as well. We are sending his urine to be evaluated for infection and this will take several days. We could try placing him on Protonix to see if this would help given that this could be related to a gastritis or stomach irritation. We will additionally await his urine studies. If he is willing to consider Protonix I can send this in. Please also confirm if he needs a refill on the glipizide. Thanks.

## 2016-06-16 NOTE — Progress Notes (Signed)
Pre visit review using our clinic review tool, if applicable. No additional management support is needed unless otherwise documented below in the visit note. 

## 2016-06-16 NOTE — Assessment & Plan Note (Signed)
Improved on recheck though still not at goal. Has been elevated at home when he does check. We will add amlodipine. He will continue his lisinopril. He is given return precautions. He'll have lab work done today as outlined below. He will return in 1 week for recheck of blood pressure.

## 2016-06-16 NOTE — Patient Instructions (Signed)
Nice to see you. We are going to obtain a CT scan of your abdomen and pelvis to evaluate for cause of your pain. We are going to add amlodipine to your blood pressure medication regimen. We are going to get some lab work as well and check a urine. If you develop worsening abdominal pain, blood in your stool, chest pain, shortness of breath, swelling in your legs, vision changes, or any new or changing symptoms please seek medical attention immediately.

## 2016-06-16 NOTE — Assessment & Plan Note (Addendum)
New onset diffuse abdominal discomfort. Has been present for the last week. Tender throughout his abdomen with mild intermittent guarding. The differential is broad at this time. We will obtain lab work as outlined below. We'll obtain a CT scan abdomen and pelvis to evaluate for potential causes. Once the CT scan and the lab work returns we will determine the next step in management. He is given return precautions.

## 2016-06-16 NOTE — Addendum Note (Signed)
Addended by: Caryl Bis ERIC G on: 06/16/2016 12:32 PM   Modules accepted: Orders

## 2016-06-17 LAB — URINE CULTURE: Organism ID, Bacteria: NO GROWTH

## 2016-06-19 ENCOUNTER — Other Ambulatory Visit: Payer: Self-pay | Admitting: Family Medicine

## 2016-06-19 DIAGNOSIS — R809 Proteinuria, unspecified: Secondary | ICD-10-CM

## 2016-07-14 ENCOUNTER — Ambulatory Visit (INDEPENDENT_AMBULATORY_CARE_PROVIDER_SITE_OTHER): Payer: 59 | Admitting: Family Medicine

## 2016-07-14 ENCOUNTER — Encounter: Payer: Self-pay | Admitting: Family Medicine

## 2016-07-14 VITALS — BP 134/82 | HR 96 | Temp 98.6°F | Wt 231.2 lb

## 2016-07-14 DIAGNOSIS — J111 Influenza due to unidentified influenza virus with other respiratory manifestations: Secondary | ICD-10-CM

## 2016-07-14 MED ORDER — HYDROCODONE-HOMATROPINE 5-1.5 MG/5ML PO SYRP: 5.0000 mL | ORAL_SOLUTION | Freq: Three times a day (TID) | ORAL | 0 refills | Status: DC | PRN

## 2016-07-14 NOTE — Progress Notes (Signed)
SUBJECTIVE:  Phillip Norman is a 46 y.o. male who present complaining of flu-like symptoms: fevers, chills, myalgias, congestion, sore throat and cough for 4 days. Denies dyspnea or wheezing.  Current Outpatient Prescriptions on File Prior to Visit  Medication Sig Dispense Refill  . amLODipine (NORVASC) 5 MG tablet Take 1 tablet (5 mg total) by mouth daily. 90 tablet 3  . lisinopril (PRINIVIL,ZESTRIL) 40 MG tablet TAKE 1 TABLET (40 MG TOTAL) BY MOUTH DAILY. 30 tablet 6  . metFORMIN (GLUCOPHAGE-XR) 500 MG 24 hr tablet TAKE 3 TABLETS (1,500 MG TOTAL) BY MOUTH DAILY WITH BREAKFAST. 90 tablet 1  . pantoprazole (PROTONIX) 40 MG tablet Take 1 tablet (40 mg total) by mouth daily. 30 tablet 3  . glipiZIDE (GLUCOTROL XL) 10 MG 24 hr tablet Take 1 tablet (10 mg total) by mouth daily with breakfast. (Patient not taking: Reported on 07/14/2016) 90 tablet 1   No current facility-administered medications on file prior to visit.     No Known Allergies  Past Medical History:  Diagnosis Date  . Chronic back pain   . Diabetes mellitus without complication ([place])   . Hyperlipidemia   . Hypertension     Past Surgical History:  Procedure Laterality Date  . TONSILLECTOMY AND ADENOIDECTOMY      Family History  Problem Relation Age of Onset  . Hypertension Mother   . Diabetes Father     Social History   Social History  . Marital status: Married    Spouse name: N/A  . Number of children: 1  . Years of education: N/A   Occupational History  . SENIOR FINANCE MANAGER    Social History Main Topics  . Smoking status: Never Smoker  . Smokeless tobacco: Never Used  . Alcohol use No  . Drug use: No  . Sexual activity: Not on file   Other Topics Concern  . Not on file   Social History Narrative   REGULAR EXERCISE-NO   MASTER LEVEL EDUCATION   The PMH, PSH, Social History, Family History, Medications, and allergies have been reviewed in [facility], and have been updated if relevant.  OBJECTIVE: BP  134/82   Pulse 96   Temp 98.6 F (37 C) (Oral)   Wt 231 lb 4 oz (104.9 kg)   SpO2 97%   BMI 29.29 kg/m   Appears moderately ill but not toxic; temperature as noted in vitals. Ears normal. Throat and pharynx normal.  Neck supple. No adenopathy in the neck. Sinuses non tender. The chest is clear.  ASSESSMENT: Influenza  PLAN: Symptomatic therapy suggested: rest, increase fluids, gargle prn for sore throat, apply cold packs prn and call prn if symptoms persist or worsen. Call or return to clinic prn if these symptoms worsen or fail to improve as anticipated.

## 2016-07-14 NOTE — Progress Notes (Signed)
Pre visit review using our clinic review tool, if applicable. No additional management support is needed unless otherwise documented below in the visit note. 

## 2016-07-15 ENCOUNTER — Other Ambulatory Visit: Payer: Self-pay | Admitting: Family Medicine

## 2016-07-15 NOTE — Telephone Encounter (Signed)
Last OV 06/16/16 last filled 02/14/16

## 2016-10-21 ENCOUNTER — Other Ambulatory Visit: Payer: Self-pay | Admitting: Family Medicine

## 2017-01-23 ENCOUNTER — Other Ambulatory Visit: Payer: Self-pay | Admitting: Family Medicine

## 2017-01-26 ENCOUNTER — Other Ambulatory Visit: Payer: Self-pay | Admitting: *Deleted

## 2017-01-26 DIAGNOSIS — I10 Essential (primary) hypertension: Secondary | ICD-10-CM

## 2017-01-26 NOTE — Telephone Encounter (Signed)
Refill Request former Ingal pt.  Please advise if ok to refill.

## 2017-01-28 ENCOUNTER — Telehealth: Payer: Self-pay | Admitting: Cardiology

## 2017-01-28 NOTE — Telephone Encounter (Signed)
Patient needs appt for Lisinopril refill     lmov to schedule

## 2017-01-29 ENCOUNTER — Telehealth: Payer: Self-pay

## 2017-01-29 NOTE — Telephone Encounter (Signed)
-----   Message from Phillip Norman, [name] sent at 01/29/2017  3:23 PM EDT ----- Patient needs a follow up appointment before refill.  Pt. Last saw Dr. Yvone Neu.  Thanks, [name]

## 2017-01-29 NOTE — Telephone Encounter (Signed)
L MOM to schedule past due f/u. 

## 2017-02-01 NOTE — Telephone Encounter (Signed)
Number was not taking calls  °Will try again at a later time ° °

## 2017-02-02 ENCOUNTER — Other Ambulatory Visit: Payer: Self-pay | Admitting: Family Medicine

## 2017-02-02 DIAGNOSIS — I10 Essential (primary) hypertension: Secondary | ICD-10-CM

## 2017-02-04 NOTE — Telephone Encounter (Signed)
Unable to contact x 3  Sent letter

## 2017-02-15 ENCOUNTER — Telehealth: Payer: Self-pay | Admitting: Family Medicine

## 2017-02-15 DIAGNOSIS — M9901 Segmental and somatic dysfunction of cervical region: Secondary | ICD-10-CM | POA: Diagnosis not present

## 2017-02-15 DIAGNOSIS — R51 Headache: Secondary | ICD-10-CM | POA: Diagnosis not present

## 2017-02-15 DIAGNOSIS — M791 Myalgia: Secondary | ICD-10-CM | POA: Diagnosis not present

## 2017-02-15 NOTE — Telephone Encounter (Signed)
Please contact patient and advise urgent care evaluation today for BP and glucose. Please also see if they can check his BP and glucose today. Please see if he has been taking his medications. Could also see if there are other appointments available with in [place]. If he prefers to wait until tomorrow to be seen with me please counsel him that if he were to develop chest pain, shortness of breath, numbness, weakness, or vision changes that he be evaluated immediately. Thanks.

## 2017-02-15 NOTE — Telephone Encounter (Signed)
[facility]  Patient Name: Phillip Norman  DOB: 05-Nov-1970    Initial Comment Husbands sugar is 212 bp 200/120   Nurse Assessment  Nurse: Wynetta Emery RN, Baker Janus Date/Time (Eastern Time): 02/15/2017 10:41:16 AM  Confirm and document reason for call. If symptomatic, describe symptoms. ---[name]'s blood sugar has been elevated for several days (has not been feeling well so decided to monitor it) along with blood pressure 217 blood sugar last evening and blood pressure is 200/120 last night.. worried that he was complaining of headache with elevated blood pressure and has not taken b/s or b/p this am.  Does the patient have any new or worsening symptoms? ---Yes  Will a triage be completed? ---Yes  Related visit to physician within the last 2 weeks? ---No  Does the PT have any chronic conditions? (i.e. diabetes, asthma, etc.) ---Yes  List chronic conditions. ---Metformin -Diabetes Lisinipril --HTN  Is this a behavioral health or substance abuse call? ---No     Guidelines    Guideline Title Affirmed Question Affirmed Notes  High Blood Pressure [6] Systolic BP >= 808 OR Diastolic >= 811 AND [0] having NO cardiac or neurologic symptoms   Diabetes - High Blood Sugar [1] Blood glucose > 240 mg/dl (13 mmol/l) AND [2] does not use insulin (e.g., not insulin-dependent; most people with type 2 diabetes)    Final Disposition User   See Physician within 4 Hours (or PCP triage) Wynetta Emery, RN, Baker Janus    Comments  NOTE appointment already scheduled by office staff for tomorrow -- no availably today and wants to keep appt tomorrow --does not want to see anyone else.  02/16/2017 1115am with Cornelius Moras MD  NOTE Please note the home number [phone] is not a working number --states he got rid of that called back to reach them when we got disconnected during triage and had to call back on cell number [phone]   Referrals   REFERRED TO PCP OFFICE   Caller Disagree/Comply Comply  Caller Understands Yes  PreDisposition Call Doctor   l

## 2017-02-15 NOTE — Telephone Encounter (Signed)
Patient declined going to urgent care or ED and would prefer to see you. BP is 145/100 and CBG 205 while I was on the phone with patients wife. Advised if he developed any of the symptoms listed below he should be evaluated immediately.

## 2017-02-15 NOTE — Telephone Encounter (Signed)
Noted. Agree with evaluation tomorrow given a blood pressure

## 2017-02-15 NOTE — Telephone Encounter (Signed)
Pt wife called about pt sugar and Bp being high. Pt Sugar is 300 and BP is 200/120. Pt is having headache. Pt has a appt for tomorrow. Pt was transferred to [facility]. Thank you!

## 2017-02-15 NOTE — Telephone Encounter (Signed)
Please advise 

## 2017-02-15 NOTE — Telephone Encounter (Signed)
[facility] spoke with patient. He has been scheduled an appt with PCP 02/16/2017.

## 2017-02-16 ENCOUNTER — Ambulatory Visit (INDEPENDENT_AMBULATORY_CARE_PROVIDER_SITE_OTHER): Payer: 59 | Admitting: Family Medicine

## 2017-02-16 ENCOUNTER — Encounter: Payer: Self-pay | Admitting: Family Medicine

## 2017-02-16 VITALS — BP 170/106 | HR 96 | Temp 98.7°F | Wt 218.8 lb

## 2017-02-16 DIAGNOSIS — E1165 Type 2 diabetes mellitus with hyperglycemia: Secondary | ICD-10-CM | POA: Diagnosis not present

## 2017-02-16 DIAGNOSIS — R0781 Pleurodynia: Secondary | ICD-10-CM

## 2017-02-16 DIAGNOSIS — I10 Essential (primary) hypertension: Secondary | ICD-10-CM

## 2017-02-16 DIAGNOSIS — IMO0001 Reserved for inherently not codable concepts without codable children: Secondary | ICD-10-CM

## 2017-02-16 LAB — COMPREHENSIVE METABOLIC PANEL
ALT: 11 U/L (ref 0–53)
AST: 13 U/L (ref 0–37)
Albumin: 4.3 g/dL (ref 3.5–5.2)
Alkaline Phosphatase: 64 U/L (ref 39–117)
BILIRUBIN TOTAL: 0.5 mg/dL (ref 0.2–1.2)
BUN: 17 mg/dL (ref 6–23)
CO2: 30 meq/L (ref 19–32)
Calcium: 10.1 mg/dL (ref 8.4–10.5)
Chloride: 102 mEq/L (ref 96–112)
Creatinine, Ser: 1.03 mg/dL (ref 0.40–1.50)
GFR: 99.68 mL/min (ref >60.00)
Glucose, Bld: 234 mg/dL — ABNORMAL HIGH (ref 70–99)
Potassium: 4.3 mEq/L (ref 3.5–5.1)
SODIUM: 139 meq/L (ref 135–145)
Total Protein: 6.9 g/dL (ref 6.0–8.3)

## 2017-02-16 LAB — CBC
HEMATOCRIT: 41.6 % (ref 39.0–52.0)
Hemoglobin: 13.5 g/dL (ref 13.0–17.0)
MCHC: 32.4 g/dL (ref 30.0–36.0)
MCV: 87 fl (ref 78.0–100.0)
Platelets: 262 10*3/uL (ref 150.0–400.0)
RBC: 4.78 Mil/uL (ref 4.22–5.81)
RDW: 13.9 % (ref 11.5–15.5)
WBC: 5.8 10*3/uL (ref 4.0–10.5)

## 2017-02-16 LAB — HEMOGLOBIN A1C: HEMOGLOBIN A1C: 10.8 % — AB (ref 4.6–6.5)

## 2017-02-16 LAB — TSH: TSH: 1.36 u[IU]/mL (ref 0.35–4.50)

## 2017-02-16 MED ORDER — AMLODIPINE BESYLATE 5 MG PO TABS: 5.0000 mg | ORAL_TABLET | Freq: Every day | ORAL | 3 refills | Status: DC

## 2017-02-16 MED ORDER — METFORMIN HCL ER 500 MG PO TB24: 2000.0000 mg | ORAL_TABLET | Freq: Every day | ORAL | 1 refills | Status: DC

## 2017-02-16 NOTE — Patient Instructions (Signed)
Nice to see you. If your blood pressure and diabetes appear to be uncontrolled. We will add amlodipine for your blood pressure. We'll check lab work and then likely increase your metformin and add another medication for your diabetes. Please monitor your rib strain. If your pain gets worse or you develop shortness of breath or chest pain please be evaluated immediately.

## 2017-02-16 NOTE — Assessment & Plan Note (Signed)
Suspect intercostal muscle strain. Tenderness on exam the no bony defects. He has good air movement. Unlikely fracture given the mechanism of injury. Discussed ice and Tylenol for pain. Advised against NSAIDs given his blood pressure. He will monitor this area and if he develops dyspnea be evaluated.

## 2017-02-16 NOTE — Assessment & Plan Note (Signed)
Uncontrolled. We will increase his metformin to 2000 mg daily. We'll check an A1c.

## 2017-02-16 NOTE — Assessment & Plan Note (Signed)
Uncontrolled. Only on lisinopril. We'll check lab work today. Start back on amlodipine. He'll follow-up as scheduled on October 5. Given return precautions.

## 2017-02-16 NOTE — Progress Notes (Signed)
  [name], MD Phone: [phone]  Phillip Norman is a 46 y.o. male who presents today for follow-up.  Hypertension: Patient notes for the last several months his blood pressures been running up in the 170s/115 on average. He is taking lisinopril. No chest pain or shortness of breath. No edema. No headaches.  Diabetes: Blood sugars have been running over 200 recently. He is taking metformin. Possibly some polyuria. No hypoglycemia. This has been going on for a couple months. No vision changes.  Patient notes yesterday he picked up his grandchild and felt pain in his left lateral ribs. Notes he saw the chiropractor who felt it was a rib strain. He notes discomfort in this area at times with breathing. Does note the discomfort does make it hard to get a deep breath at times though he notes no dyspnea. He has been icing it. He follows up with a chiropractor on Wednesday.  PMH: nonsmoker.   ROS see history of present illness  Objective  Physical Exam Vitals:   02/16/17 1115 02/16/17 1152  BP: (!) 170/106   Pulse: (!) 112 96  Temp: 98.7 F (37.1 C)   SpO2: 95%     BP Readings from Last 3 Encounters:  02/16/17 (!) 170/106  07/14/16 134/82  06/16/16 (!) 148/98   Wt Readings from Last 3 Encounters:  02/16/17 218 lb 12.8 oz (99.2 kg)  07/14/16 231 lb 4 oz (104.9 kg)  06/16/16 234 lb (106.1 kg)    Physical Exam  Constitutional: No distress.  Cardiovascular: Normal rate, regular rhythm and normal heart sounds.   Pulmonary/Chest: Effort normal and breath sounds normal.  Musculoskeletal: He exhibits no edema.  Neurological: He is alert. Gait normal.  Skin: Skin is warm and dry. He is not diaphoretic.   left ribs with tenderness though no bony defects, no flail chest   Assessment/Plan: Please see individual problem list.  Essential hypertension Uncontrolled. Only on lisinopril. We'll check lab work today. Start back on amlodipine. He'll follow-up as scheduled on October 5.  Given return precautions.  Diabetes mellitus type 2, uncontrolled, without complications (HCC) Uncontrolled. We will increase his metformin to 2000 mg daily. We'll check an A1c.  Rib pain on left side Suspect intercostal muscle strain. Tenderness on exam the no bony defects. He has good air movement. Unlikely fracture given the mechanism of injury. Discussed ice and Tylenol for pain. Advised against NSAIDs given his blood pressure. He will monitor this area and if he develops dyspnea be evaluated.   Orders Placed This Encounter  Procedures  . HgB A1c  . Comp Met (CMET)  . TSH  . CBC    Meds ordered this encounter  Medications  . amLODipine (NORVASC) 5 MG tablet    Sig: Take 1 tablet (5 mg total) by mouth daily.    Dispense:  90 tablet    Refill:  3  . metFORMIN (GLUCOPHAGE-XR) 500 MG 24 hr tablet    Sig: Take 4 tablets (2,000 mg total) by mouth daily with breakfast.    Dispense:  360 tablet    Refill:  [place], MD [place]

## 2017-02-17 DIAGNOSIS — M9901 Segmental and somatic dysfunction of cervical region: Secondary | ICD-10-CM | POA: Diagnosis not present

## 2017-02-17 DIAGNOSIS — R51 Headache: Secondary | ICD-10-CM | POA: Diagnosis not present

## 2017-02-17 DIAGNOSIS — M791 Myalgia: Secondary | ICD-10-CM | POA: Diagnosis not present

## 2017-02-17 NOTE — Progress Notes (Signed)
Left message to return call 

## 2017-02-22 DIAGNOSIS — M791 Myalgia, unspecified site: Secondary | ICD-10-CM | POA: Diagnosis not present

## 2017-02-22 DIAGNOSIS — R51 Headache: Secondary | ICD-10-CM | POA: Diagnosis not present

## 2017-02-22 DIAGNOSIS — M9901 Segmental and somatic dysfunction of cervical region: Secondary | ICD-10-CM | POA: Diagnosis not present

## 2017-02-25 NOTE — Progress Notes (Signed)
Unable to reach patient, he is scheduled for follow up tomorrow

## 2017-02-26 ENCOUNTER — Ambulatory Visit (INDEPENDENT_AMBULATORY_CARE_PROVIDER_SITE_OTHER): Payer: 59 | Admitting: Family Medicine

## 2017-02-26 ENCOUNTER — Encounter: Payer: Self-pay | Admitting: Family Medicine

## 2017-02-26 DIAGNOSIS — E1165 Type 2 diabetes mellitus with hyperglycemia: Secondary | ICD-10-CM

## 2017-02-26 DIAGNOSIS — Z23 Encounter for immunization: Secondary | ICD-10-CM

## 2017-02-26 DIAGNOSIS — F419 Anxiety disorder, unspecified: Secondary | ICD-10-CM | POA: Diagnosis not present

## 2017-02-26 DIAGNOSIS — I10 Essential (primary) hypertension: Secondary | ICD-10-CM | POA: Diagnosis not present

## 2017-02-26 DIAGNOSIS — R809 Proteinuria, unspecified: Secondary | ICD-10-CM

## 2017-02-26 DIAGNOSIS — IMO0001 Reserved for inherently not codable concepts without codable children: Secondary | ICD-10-CM

## 2017-02-26 MED ORDER — EMPAGLIFLOZIN 10 MG PO TABS: 10.0000 mg | ORAL_TABLET | Freq: Every day | ORAL | 3 refills | Status: DC

## 2017-02-26 MED ORDER — AMLODIPINE BESYLATE 5 MG PO TABS: 10.0000 mg | ORAL_TABLET | Freq: Every day | ORAL | 3 refills | Status: DC

## 2017-02-26 NOTE — Assessment & Plan Note (Signed)
Improved. Still not at goal. We'll increase his amlodipine. The Jardiance could be beneficial for BP as well. Recheck in 2 weeks.

## 2017-02-26 NOTE — Assessment & Plan Note (Signed)
Previously saw nephrology. Has not followed up with them. He wants to defer urine testing to his follow-up in 2-3 weeks. Repeat UA and protein creatinine ratio.

## 2017-02-26 NOTE — Assessment & Plan Note (Signed)
Patient with stress and anxiety. Related to his job. He is looking for a new job. He does not want treatment for this currently. He will monitor.

## 2017-02-26 NOTE — Patient Instructions (Addendum)
Nice to see you. We will have you increase your amlodipine to 10 mg daily. We'll start you on Jardiance in addition to your metformin for your diabetes. Please monitor your stress and anxiety and if this worsens please let us know. If you develop lightheadedness, blood sugars less than 80, or any new or changing symptoms please let us know.

## 2017-02-26 NOTE — Assessment & Plan Note (Addendum)
A1c elevated. Continue metformin. Start on [place]. Discussed insulin though he does not want to start on this. Discussed sick precautions with Jardiance. Coupon card given. Discussed hypoglycemic precautions. He'll let us know what his sugars have been running at his nurse visit in 2 weeks.

## 2017-02-26 NOTE — Progress Notes (Signed)
  [name], MD Phone: [phone]  Phillip Norman is a 46 y.o. male who presents today for f/u.  DIABETES Disease Monitoring: Blood Sugar ranges-130s to 150s the last couple of days Polyuria/phagia/dipsia- no       Medications: Compliance- taking metformin Hypoglycemic symptoms- no He started to exercise over the last 2 days.  Hypertension: 140-145/95-105. No chest pain, shortness breath, or edema.  He notes some stress and anxiety related to work. He notes he feels this is contributing to his other medical issues. No depression. He thinks if he got a new job that would be beneficial all around. Not interested in medication.   PMH: nonsmoker.   ROS see history of present illness  Objective  Physical Exam Vitals:   02/26/17 1541  BP: (!) 142/100  Pulse: 80  Temp: 98.3 F (36.8 C)  SpO2: 98%    BP Readings from Last 3 Encounters:  02/26/17 (!) 142/100  02/16/17 (!) 170/106  07/14/16 134/82   Wt Readings from Last 3 Encounters:  02/26/17 219 lb 9.6 oz (99.6 kg)  02/16/17 218 lb 12.8 oz (99.2 kg)  07/14/16 231 lb 4 oz (104.9 kg)    Physical Exam  Constitutional: No distress.  Cardiovascular: Normal rate, regular rhythm and normal heart sounds.   Pulmonary/Chest: Effort normal and breath sounds normal.  Musculoskeletal: He exhibits no edema.  Neurological: He is alert. Gait normal.  Skin: Skin is warm and dry. He is not diaphoretic.     Assessment/Plan: Please see individual problem list.  Essential hypertension Improved. Still not at goal. We'll increase his amlodipine. The Jardiance could be beneficial for BP as well. Recheck in 2 weeks.  Diabetes mellitus type 2, uncontrolled, without complications (HCC) Q2W elevated. Continue metformin. Start on [place]. Discussed insulin though he does not want to start on this. Discussed sick precautions with Jardiance. Coupon card given. Discussed hypoglycemic precautions. He'll let us know what his sugars have  been running at his nurse visit in 2 weeks.  Anxiety Patient with stress and anxiety. Related to his job. He is looking for a new job. He does not want treatment for this currently. He will monitor.  Proteinuria Previously saw nephrology. Has not followed up with them. He wants to defer urine testing to his follow-up in 2-3 weeks. Repeat UA and protein creatinine ratio.   Orders Placed This Encounter  Procedures  . Flu Vaccine QUAD 36+ mos IM  . Protein / creatinine ratio, urine    Standing Status:   Future    Standing Expiration Date:   02/26/2018  . POCT Urinalysis Dipstick    Standing Status:   Future    Standing Expiration Date:   04/28/2017    Meds ordered this encounter  Medications  . amLODipine (NORVASC) 5 MG tablet    Sig: Take 2 tablets (10 mg total) by mouth daily.    Dispense:  180 tablet    Refill:  3  . empagliflozin (JARDIANCE) 10 MG TABS tablet    Sig: Take 10 mg by mouth daily.    Dispense:  30 tablet    Refill:  [place], MD [place]

## 2017-03-01 DIAGNOSIS — M9901 Segmental and somatic dysfunction of cervical region: Secondary | ICD-10-CM | POA: Diagnosis not present

## 2017-03-01 DIAGNOSIS — M791 Myalgia, unspecified site: Secondary | ICD-10-CM | POA: Diagnosis not present

## 2017-03-01 DIAGNOSIS — R51 Headache: Secondary | ICD-10-CM | POA: Diagnosis not present

## 2017-03-11 ENCOUNTER — Ambulatory Visit (INDEPENDENT_AMBULATORY_CARE_PROVIDER_SITE_OTHER): Payer: 59

## 2017-03-11 VITALS — BP 126/80 | HR 80

## 2017-03-11 DIAGNOSIS — I10 Essential (primary) hypertension: Secondary | ICD-10-CM

## 2017-03-11 NOTE — Progress Notes (Signed)
Patient comes in for blood pressure check after increasing amlodipine to 10 mg. Patient states he has been checking blood pressure at home however it has been running around 140 /90 however after exercising has seen lower numbers .   He has noticed increased constipation not sure if this is side effect of medication. Patient denies swelling in lower extremities.   He will increase fluid intake and will begin stool softener.    Reviewed.  Blood pressure reading improved.  Will need to continue to follow pressures and let us know if constipation continues to be a problem.  Please show to Dr Caryl Bis.    Dr Nicki Reaper

## 2017-03-12 NOTE — Progress Notes (Signed)
Signed noted.  Please show to Dr Caryl Bis.  Will need to follow up if constipation is a persistent problem.

## 2017-03-12 NOTE — Progress Notes (Signed)
Please review

## 2017-03-13 NOTE — Progress Notes (Signed)
Agree. BP improved. Plan to recheck with nurse visit in one month. Continue to monitor at home. If constipation not improving we will need to consider an alternative medication.

## 2017-03-16 NOTE — Progress Notes (Signed)
Left voice mail to call back 

## 2017-03-23 IMAGING — CT CT ABD-PELV W/ CM
2 of 5 series · 17 of 46 positions shown, 19 images · IV contrast (APPLIED)
Comparison: None.

CLINICAL DATA: General abdominal pain with nausea for 1 week

EXAM:
CT ABDOMEN AND PELVIS WITH CONTRAST
TECHNIQUE: Multidetector CT imaging of the abdomen and pelvis was performed
using the standard protocol following bolus administration of
intravenous contrast.
CONTRAST:  100mL CPTVOE-0KK IOPAMIDOL (CPTVOE-0KK) INJECTION 61%

[Series 2: routine abd/pel with · axial · 0.85mm/px · z∈[-506,-76]mm · 14 of 98 slices shown, 16 images]
[im 6/98  soft-tissue]
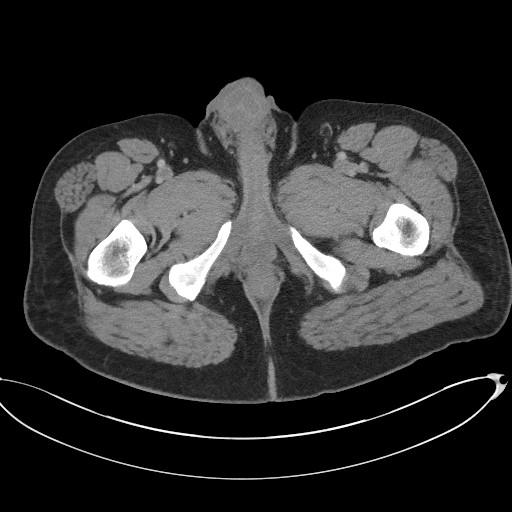
[im 6/98  bone]
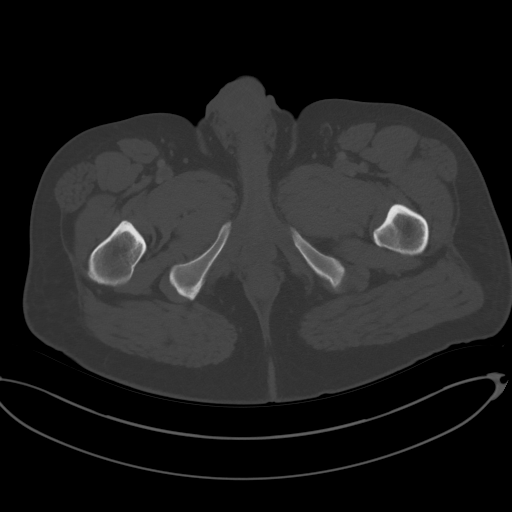
[im 11/98  soft-tissue]
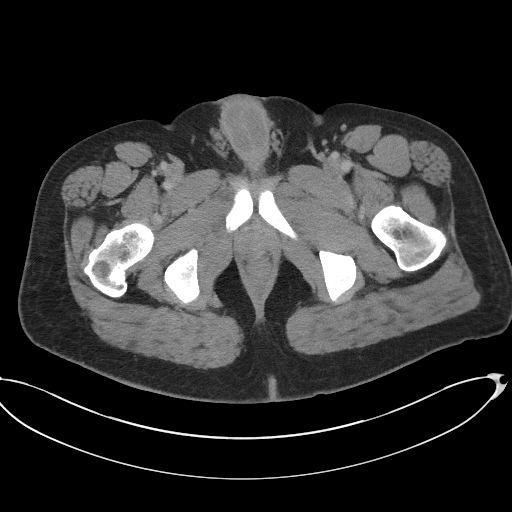
[im 22/98  soft-tissue]
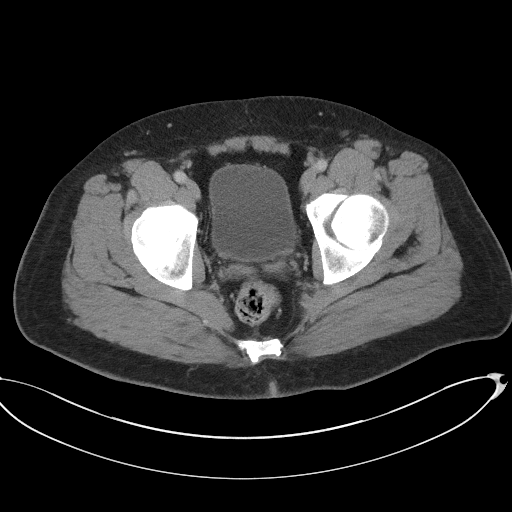
[im 27/98  soft-tissue]
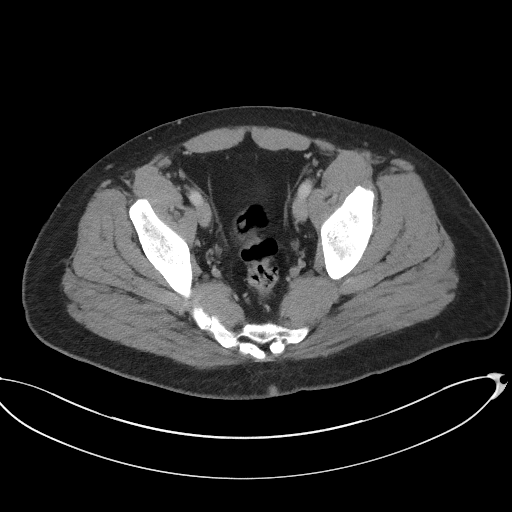
[im 33/98  soft-tissue]
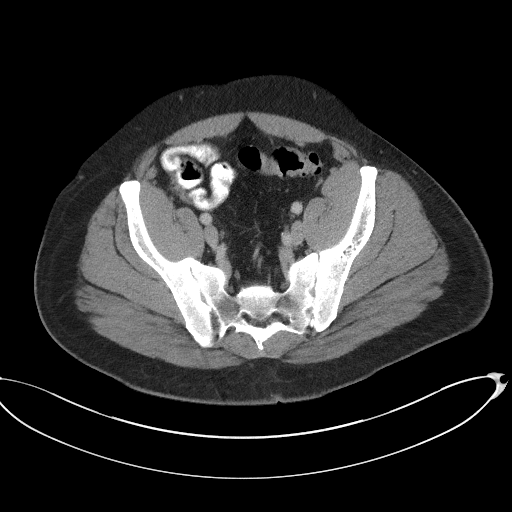
[im 38/98  soft-tissue]
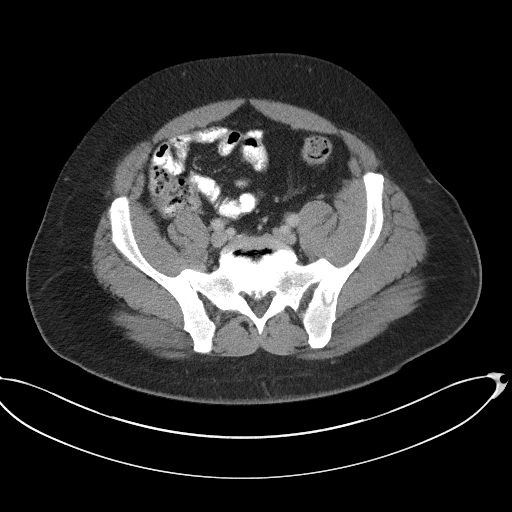
[im 44/98  soft-tissue]
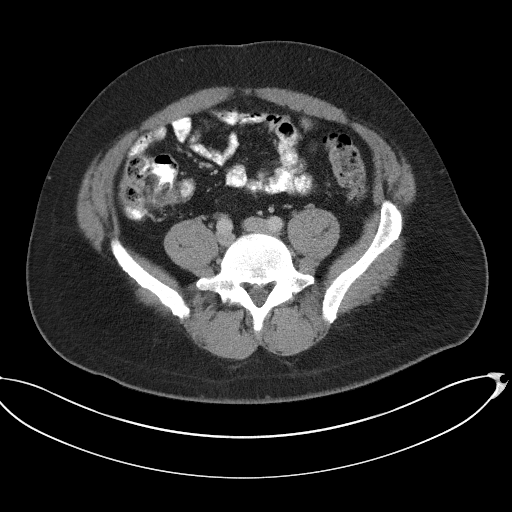
[im 54/98  soft-tissue]
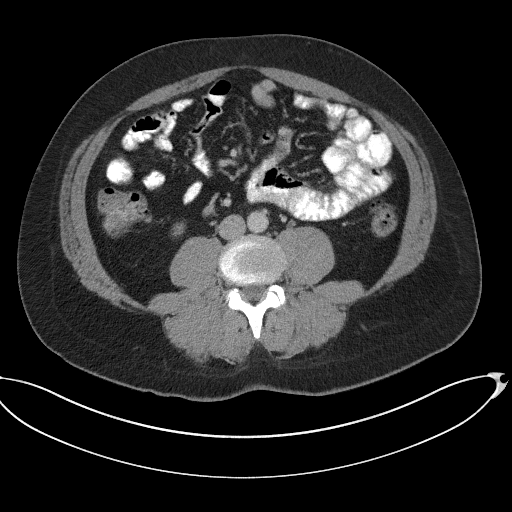
[im 60/98  soft-tissue]
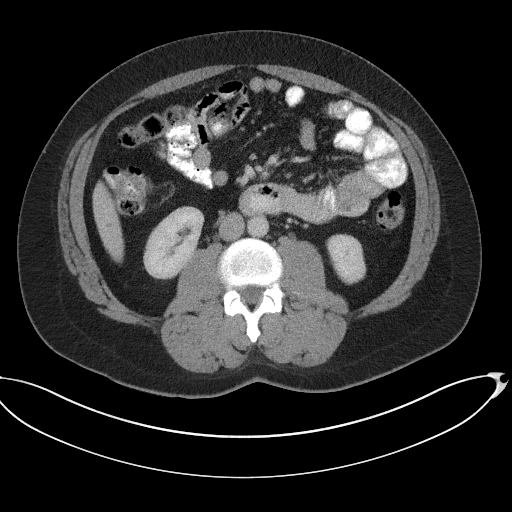
[im 60/98  bone]
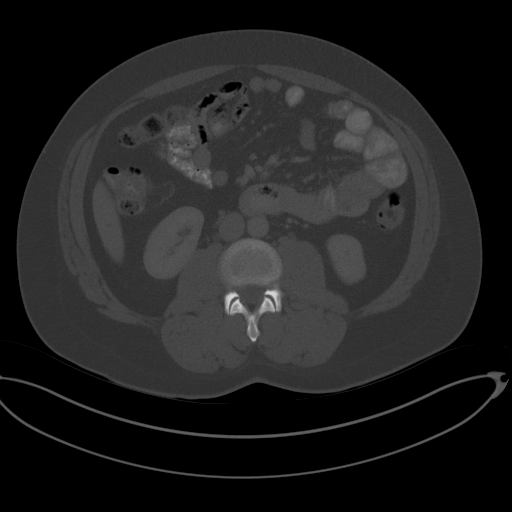
[im 65/98  soft-tissue]
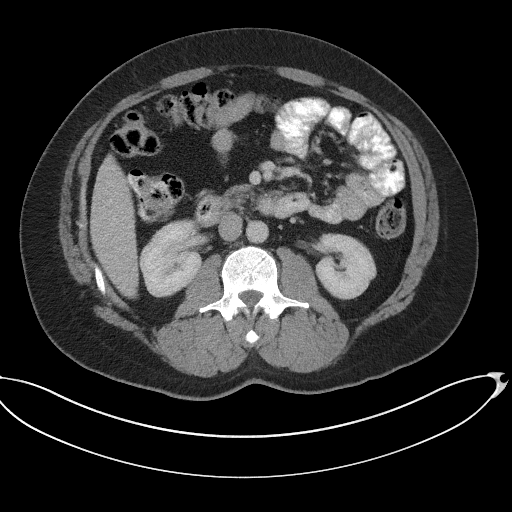
[im 71/98  soft-tissue]
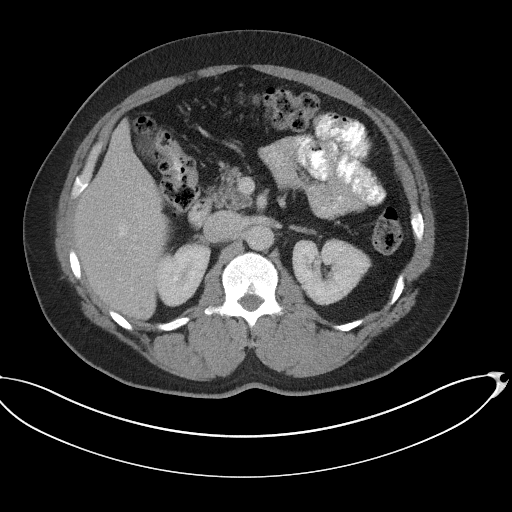
[im 76/98  soft-tissue]
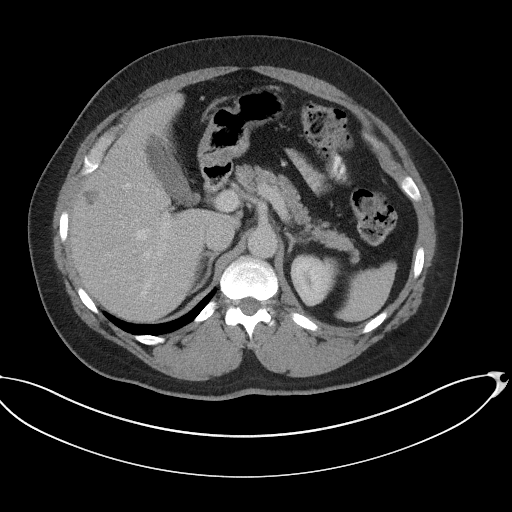
[im 87/98  soft-tissue]
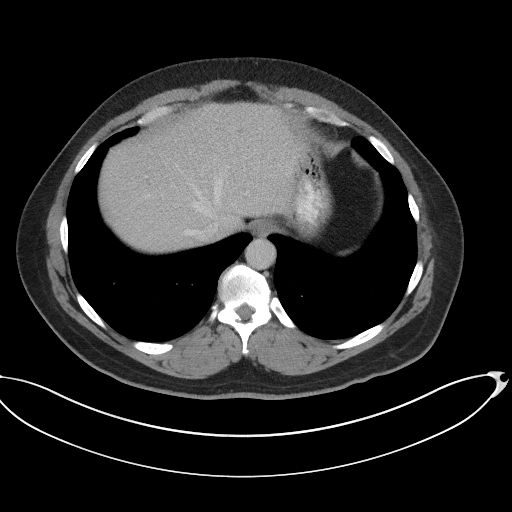
[im 92/98  soft-tissue]
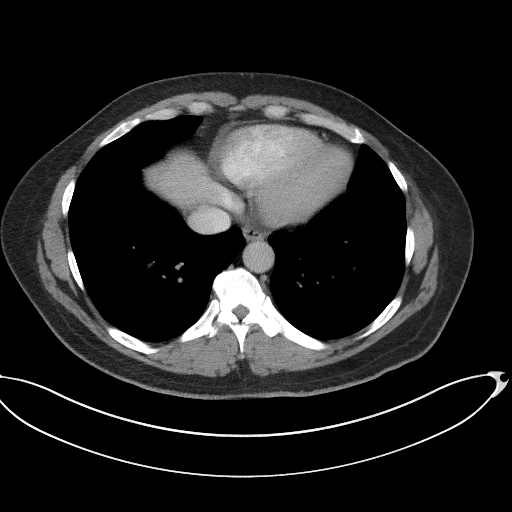

[Series 5: coronal st · coronal · 0.88mm/px · 3 of 104 slices shown]
[im 35/104  soft-tissue]
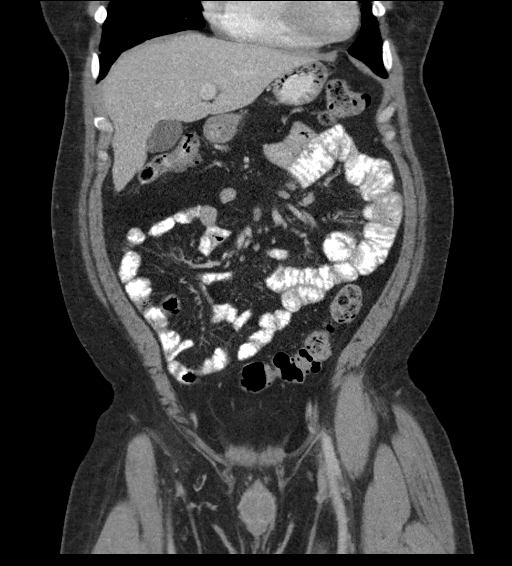
[im 46/104  soft-tissue]
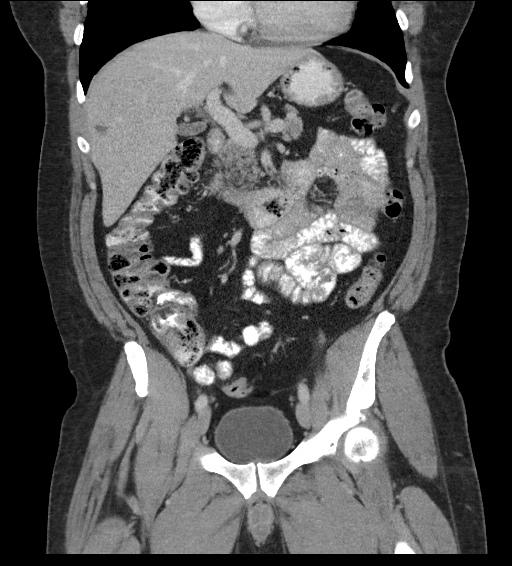
[im 58/104  soft-tissue]
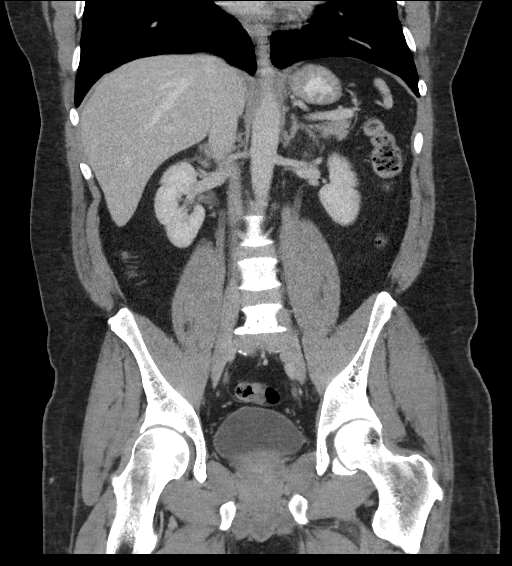

[17 of 46 positions shown; findings below may reference images not displayed]

FINDINGS: Lower chest: Lung bases are clear. No effusions. Heart is normal
size.

Hepatobiliary: Scattered tiny hypodensities throughout the liver,
likely small cysts. No biliary duct dilatation. Gallbladder
unremarkable.

Pancreas: No focal abnormality or ductal dilatation.

Spleen: No focal abnormality.  Normal size.

Adrenals/Urinary Tract: No adrenal abnormality. No focal renal
abnormality. No stones or hydronephrosis. Urinary bladder is
unremarkable.

Stomach/Bowel: Stomach, large and small bowel grossly unremarkable.
Appendix is normal.

Vascular/Lymphatic: No evidence of aneurysm or adenopathy.

Reproductive: No visible focal abnormality.

Other: No free fluid or free air.

Musculoskeletal: No acute bony abnormality or focal bone lesion.
Degenerative disc disease at L5-S1.
IMPRESSION: No acute or significant abnormality in the abdomen or pelvis.

## 2017-03-25 NOTE — Progress Notes (Signed)
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

## 2017-03-30 ENCOUNTER — Telehealth: Payer: Self-pay

## 2017-03-30 NOTE — Telephone Encounter (Signed)
Copied from [place] #[id number]. Topic: General - Other >> Mar 30, 2017  9:30 AM Arletha Grippe wrote: Reason for CRM: pt received letter, please call pt back

## 2017-03-30 NOTE — Telephone Encounter (Signed)
Left message to return call regarding nurse visit for bp, Per Dr.Sonnenberg patient should have 1 month nurse bp check visit and monitor bp. Also if constipation continues he should let us know.

## 2017-04-05 NOTE — Telephone Encounter (Signed)
Left message to return call, ok for PEC to give results and speak to patient  

## 2017-04-14 NOTE — Telephone Encounter (Signed)
unable to reach patient

## 2017-05-27 NOTE — Progress Notes (Signed)
Pt coming in on 06/04/17 for f/u with Dr. Caryl Bis.

## 2017-06-04 ENCOUNTER — Ambulatory Visit (INDEPENDENT_AMBULATORY_CARE_PROVIDER_SITE_OTHER): Payer: 59 | Admitting: Family Medicine

## 2017-06-04 ENCOUNTER — Encounter: Payer: Self-pay | Admitting: Family Medicine

## 2017-06-04 ENCOUNTER — Other Ambulatory Visit: Payer: Self-pay

## 2017-06-04 VITALS — BP 132/70 | HR 63 | Temp 97.6°F | Ht 74.5 in | Wt 206.4 lb

## 2017-06-04 DIAGNOSIS — Z8042 Family history of malignant neoplasm of prostate: Secondary | ICD-10-CM

## 2017-06-04 DIAGNOSIS — E663 Overweight: Secondary | ICD-10-CM | POA: Diagnosis not present

## 2017-06-04 DIAGNOSIS — R809 Proteinuria, unspecified: Secondary | ICD-10-CM

## 2017-06-04 DIAGNOSIS — IMO0001 Reserved for inherently not codable concepts without codable children: Secondary | ICD-10-CM

## 2017-06-04 DIAGNOSIS — E1165 Type 2 diabetes mellitus with hyperglycemia: Secondary | ICD-10-CM

## 2017-06-04 DIAGNOSIS — I10 Essential (primary) hypertension: Secondary | ICD-10-CM | POA: Diagnosis not present

## 2017-06-04 NOTE — Assessment & Plan Note (Signed)
Discussed prostate cancer screening.  PSA ordered.  Discussed rectal exam though he declined.

## 2017-06-04 NOTE — Assessment & Plan Note (Signed)
Well-controlled.  Check A1c.  Continue metformin. 

## 2017-06-04 NOTE — Progress Notes (Signed)
  [name], MD Phone: [phone]  Phillip Norman is a 47 y.o. male who presents today for follow-up.  Hypertension: Not checking at home.  Taking amlodipine, lisinopril.  No chest pain, shortness of breath, or edema.  Next  Diabetes: Typically in the low 100s less than 120.  Taking metformin.  No polyuria or polydipsia.  No hypoglycemia.  Has had proteinuria in the past.  He is on lisinopril.  He is due for recheck.  He did not end up seeing the nephrologist as previously discussed.  He has lost a fair amount of weight purposefully.  He has altered his diet and is eating much less junk food.  Exercising 6 out of 7 days a week.  Patient reports a family history of prostate cancer in 2 uncles.  Social History   Tobacco Use  Smoking Status Never Smoker  Smokeless Tobacco Never Used     ROS see history of present illness  Objective  Physical Exam Vitals:   06/04/17 1600  BP: 132/70  Pulse: 63  Temp: 97.6 F (36.4 C)  SpO2: 97%    BP Readings from Last 3 Encounters:  06/04/17 132/70  03/11/17 126/80  02/26/17 (!) 142/100   Wt Readings from Last 3 Encounters:  06/04/17 206 lb 6.4 oz (93.6 kg)  02/26/17 219 lb 9.6 oz (99.6 kg)  02/16/17 218 lb 12.8 oz (99.2 kg)    Physical Exam  Constitutional: No distress.  Cardiovascular: Normal rate, regular rhythm and normal heart sounds.  Pulmonary/Chest: Effort normal and breath sounds normal.  Musculoskeletal: He exhibits no edema.  Neurological: He is alert. Gait normal.  Skin: Skin is warm and dry. He is not diaphoretic.     Assessment/Plan: Please see individual problem list.  Proteinuria Recheck today.  Essential hypertension Well-controlled.  Continue current regimen.  Check lab work.  Diabetes mellitus type 2, uncontrolled, without complications ([place]) Well-controlled.  Check A1c.  Continue metformin.  Overweight Weight continues to trend down purposefully.  He will continue diet and  exercise.  Family history of prostate cancer Discussed prostate cancer screening.  PSA ordered.  Discussed rectal exam though he declined.   Hannan was seen today for follow-up.  Diagnoses and all orders for this visit:  Diabetes mellitus type 2, uncontrolled, without complications ([place]) -     Cancel: Comp Met (CMET) -     Cancel: HgB A1c -     Comp Met (CMET) -     HgB A1c  Family history of prostate cancer -     PSA  Proteinuria, unspecified type -     Protein / creatinine ratio, urine -     Urinalysis, Routine w reflex microscopic  Essential hypertension  Overweight    Orders Placed This Encounter  Procedures  . Comp Met (CMET)  . HgB A1c  . PSA  . Urinalysis, Routine w reflex microscopic    No orders of the defined types were placed in this encounter.    [name], MD [place]

## 2017-06-04 NOTE — Patient Instructions (Signed)
Nice to see you. We will check lab work and contact you with the results. Please continue your current regimen for now.  Please continue to exercise.

## 2017-06-04 NOTE — Assessment & Plan Note (Signed)
Weight continues to trend down purposefully.  He will continue diet and exercise.

## 2017-06-04 NOTE — Assessment & Plan Note (Signed)
Well-controlled.  Continue current regimen.  Check lab work. 

## 2017-06-04 NOTE — Assessment & Plan Note (Signed)
Recheck today. 

## 2017-06-05 LAB — HEMOGLOBIN A1C
HEMOGLOBIN A1C: 6.4 %{Hb} — AB (ref <5.7)
Mean Plasma Glucose: 137 (calc)
eAG (mmol/L): 7.6 (calc)

## 2017-06-05 LAB — URINALYSIS, ROUTINE W REFLEX MICROSCOPIC
BILIRUBIN URINE: NEGATIVE
Bacteria, UA: NONE SEEN /HPF
GLUCOSE, UA: NEGATIVE
Hgb urine dipstick: NEGATIVE
Hyaline Cast: NONE SEEN /LPF
Ketones, ur: NEGATIVE
Leukocytes, UA: NEGATIVE
NITRITE: NEGATIVE
RBC / HPF: NONE SEEN /HPF (ref 0–2)
Specific Gravity, Urine: 1.017 (ref 1.001–1.03)
Squamous Epithelial / LPF: NONE SEEN /HPF (ref <=5)
WBC UA: NONE SEEN /HPF (ref 0–5)

## 2017-06-05 LAB — PROTEIN / CREATININE RATIO, URINE
Creatinine, Urine: 105 mg/dL (ref 20–320)
Protein/Creat Ratio: 248 mg/g creat — ABNORMAL HIGH (ref 22–128)
TOTAL PROTEIN, URINE: 26 mg/dL — AB (ref 5–25)

## 2017-06-05 LAB — COMPREHENSIVE METABOLIC PANEL
AG RATIO: 1.8 (calc) (ref 1.0–2.5)
ALBUMIN MSPROF: 4.4 g/dL (ref 3.6–5.1)
ALT: 8 U/L — ABNORMAL LOW (ref 9–46)
AST: 14 U/L (ref 10–40)
Alkaline phosphatase (APISO): 65 U/L (ref 40–115)
BILIRUBIN TOTAL: 0.3 mg/dL (ref 0.2–1.2)
BUN: 12 mg/dL (ref 7–25)
CALCIUM: 9.9 mg/dL (ref 8.6–10.3)
CHLORIDE: 103 mmol/L (ref 98–110)
CO2: 26 mmol/L (ref 20–32)
Creat: 0.88 mg/dL (ref 0.60–1.35)
GLOBULIN: 2.5 g/dL (ref 1.9–3.7)
Glucose, Bld: 98 mg/dL (ref 65–99)
POTASSIUM: 4.1 mmol/L (ref 3.5–5.3)
Sodium: 139 mmol/L (ref 135–146)
Total Protein: 6.9 g/dL (ref 6.1–8.1)

## 2017-06-05 LAB — PSA: PSA: 0.8 ng/mL (ref <=4.0)

## 2017-06-11 ENCOUNTER — Telehealth: Payer: Self-pay

## 2017-06-11 NOTE — Telephone Encounter (Signed)
He can decrease his metformin to 1000 mg daily.  Thanks.

## 2017-06-11 NOTE — Telephone Encounter (Signed)
Patient was given lab results from 06/04/17, he would like to know if he needs to decrease his metformin. Patient states he now works out 6 days a week and his fasting blood sugar is in the 90s.

## 2017-06-12 ENCOUNTER — Other Ambulatory Visit: Payer: Self-pay | Admitting: Family Medicine

## 2017-06-12 DIAGNOSIS — E119 Type 2 diabetes mellitus without complications: Secondary | ICD-10-CM

## 2017-06-14 NOTE — Telephone Encounter (Signed)
Patient notified

## 2017-06-19 ENCOUNTER — Other Ambulatory Visit: Payer: Self-pay | Admitting: Family Medicine

## 2017-06-19 DIAGNOSIS — R809 Proteinuria, unspecified: Secondary | ICD-10-CM

## 2017-06-24 ENCOUNTER — Ambulatory Visit: Payer: 59 | Admitting: Family Medicine

## 2017-06-24 ENCOUNTER — Encounter: Payer: Self-pay | Admitting: Family Medicine

## 2017-06-24 ENCOUNTER — Ambulatory Visit (INDEPENDENT_AMBULATORY_CARE_PROVIDER_SITE_OTHER): Payer: 59 | Admitting: Family Medicine

## 2017-06-24 VITALS — BP 134/82 | HR 70 | Temp 98.6°F | Wt 203.0 lb

## 2017-06-24 DIAGNOSIS — M79671 Pain in right foot: Secondary | ICD-10-CM

## 2017-06-24 NOTE — Patient Instructions (Addendum)
Great that symptoms have improved. You can use acetaminophen (Tylenol) for discomfort and if symptom does not fully resolve or pain returns, follow up with your provider for further evaluation.   Foot Pain Many things can cause foot pain. Some common causes are:  An injury.  A sprain.  Arthritis.  Blisters.  Bunions.  Follow these instructions at home: Pay attention to any changes in your symptoms. Take these actions to help with your discomfort:  If directed, put ice on the affected area: ? Put ice in a plastic bag. ? Place a towel between your skin and the bag. ? Leave the ice on for 15-20 minutes, 3?4 times a day for 2 days.  Take over-the-counter and prescription medicines only as told by your health care provider.  Wear comfortable, supportive shoes that fit you well. Do not wear high heels.  Do not stand or walk for long periods of time.  Do not lift a lot of weight. This can put added pressure on your feet.  Do stretches to relieve foot pain and stiffness as told by your health care provider.  Rub your foot gently.  Keep your feet clean and dry.  Contact a health care provider if:  Your pain does not get better after a few days of self-care.  Your pain gets worse.  You cannot stand on your foot. Get help right away if:  Your foot is numb or tingling.  Your foot or toes are swollen.  Your foot or toes turn white or blue.  You have warmth and redness along your foot. This information is not intended to replace advice given to you by your health care provider. Make sure you discuss any questions you have with your health care provider. Document Released: 06/07/2015 Document Revised: 10/17/2015 Document Reviewed: 06/06/2014 Elsevier Interactive Patient Education  [employer].

## 2017-06-24 NOTE — Progress Notes (Signed)
Subjective:    Patient ID: Phillip Norman, male    DOB: August 28, 1970, 47 y.o.   MRN: [id number]  HPI  Phillip Norman is a 47 year old male who presents today with right foot pain that was noticed 6 days ago.  Pain is described as aching that has improved and is "not noticeable" today Rated as 5 or 6 at the worst  but noted as zero today Constant or intermittent: Initially noted as pain with pressure and improvement with rest Numbness or paraesthesia: No Radiation of pain: No Weight bearing cause pain: Yes but this has improved over the last 2 days. Change in shoe wear: No Injury: No trauma noted. He reports feeling low back pain and he noticed foot pain after bending down on the ground to give his daughter a bath. He states that this happens intermittently as he must bend over and reach to use the shower/tub enclosure. He denies back pain today and states that discomfort in his back was just noticed with reaching and resolved.  History of foot problems: No Treatment at home: Resting and elevating foot has provided excellent benefit. Wearing tennis shoes has provided excellent benefit.   Review of Systems  Constitutional: Negative for chills, fatigue and fever.  Respiratory: Negative for cough, shortness of breath and wheezing.   Cardiovascular: Negative for chest pain and palpitations.  Gastrointestinal: Negative for abdominal pain.  Musculoskeletal: Negative for myalgias.       Right foot swelling and pain   Past Medical History:  Diagnosis Date  . Chronic back pain   . Diabetes mellitus without complication ([place])   . Hyperlipidemia   . Hypertension      Social History   Socioeconomic History  . Marital status: Married    Spouse name: Not on file  . Number of children: 1  . Years of education: Not on file  . Highest education level: Not on file  Social Needs  . Financial resource strain: Not on file  . Food insecurity - worry: Not on file  . Food insecurity - inability: Not  on file  . Transportation needs - medical: Not on file  . Transportation needs - non-medical: Not on file  Occupational History  . Occupation: SENIOR FINANCE MANAGER  Tobacco Use  . Smoking status: Never Smoker  . Smokeless tobacco: Never Used  Substance and Sexual Activity  . Alcohol use: No  . Drug use: No  . Sexual activity: Not on file  Other Topics Concern  . Not on file  Social History Narrative   REGULAR EXERCISE-NO   MASTER LEVEL EDUCATION    Past Surgical History:  Procedure Laterality Date  . TONSILLECTOMY AND ADENOIDECTOMY      Family History  Problem Relation Age of Onset  . Hypertension Mother   . Diabetes Father     No Known Allergies  Current Outpatient Medications on File Prior to Visit  Medication Sig Dispense Refill  . amLODipine (NORVASC) 5 MG tablet Take 5 mg by mouth daily.    Marland Kitchen lisinopril (PRINIVIL,ZESTRIL) 40 MG tablet TAKE 1 TABLET (40 MG TOTAL) BY MOUTH DAILY. 30 tablet 6  . metFORMIN (GLUCOPHAGE-XR) 500 MG 24 hr tablet Take 4 tablets (2,000 mg total) by mouth daily with breakfast. 360 tablet 1   No current facility-administered medications on file prior to visit.     BP 134/82   Pulse 70   Temp 98.6 F (37 C) (Oral)   Wt 203 lb (92.1 kg)   SpO2  97%   BMI 25.71 kg/m       Objective:   Physical Exam  Constitutional: He is oriented to person, place, and time. He appears well-developed and well-nourished.  Eyes: Pupils are equal, round, and reactive to light. No scleral icterus.  Neck: Neck supple.  Cardiovascular: Normal rate, regular rhythm and intact distal pulses.  Pulmonary/Chest: Effort normal and breath sounds normal. He has no wheezes. He has no rales.  Musculoskeletal: He exhibits no edema.  No limping with ambulation or alteration in gait. No edema, color change, abnormal asymmetry, tenderness or warmth to palpation present. No restrictions of movement to MTP joints. Full ROM of foot and ankle. Small callus noted on 5th toe.  Small callus on plantar surface of right foot.   Spine with normal alignment and no deformity. No tenderness to vertebral process. No tenderness to paraspinous muscles. No CVA tenderness present. Able to heel/toe walk without pain.   Lymphadenopathy:    He has no cervical adenopathy.  Neurological: He is alert and oriented to person, place, and time. Coordination normal.  Skin: Skin is warm and dry. No rash noted. No erythema.      Assessment & Plan:  1. Right foot pain Exam is reassuring; symptom of foot pain has improved; no difficulty with ambulation. It is unclear source of pain but may be related to movement that occurred with bathing his daughter. With unremarkable exam and improvement, he would like to continue supportive measures and use acetaminophen for pain if needed. Imaging of foot can be obtained if pain does not continue to resolve, worsen, or new symptoms develop. He politely declines imaging today. Advised follow up for worsening symptoms, numbness, tingling, swelling, warmth or erythema of foot. Patient verbalized understanding and agreed with plan.  Delano Metz, FNP-C

## 2017-07-12 ENCOUNTER — Other Ambulatory Visit: Payer: Self-pay

## 2017-07-12 MED ORDER — AMLODIPINE BESYLATE 10 MG PO TABS: 10.0000 mg | ORAL_TABLET | Freq: Every day | ORAL | 3 refills | Status: DC

## 2017-07-12 NOTE — Telephone Encounter (Signed)
Received refill requesting and pharmacy states patient is requesting new rx for amlodipine 10 mg please advise

## 2017-07-12 NOTE — Telephone Encounter (Signed)
Sent to pharmacy 

## 2017-08-06 ENCOUNTER — Telehealth: Payer: Self-pay | Admitting: *Deleted

## 2017-08-06 ENCOUNTER — Other Ambulatory Visit (INDEPENDENT_AMBULATORY_CARE_PROVIDER_SITE_OTHER): Payer: 59

## 2017-08-06 DIAGNOSIS — E119 Type 2 diabetes mellitus without complications: Secondary | ICD-10-CM | POA: Diagnosis not present

## 2017-08-06 LAB — POCT GLYCOSYLATED HEMOGLOBIN (HGB A1C): Hemoglobin A1C: 6.8

## 2017-08-06 NOTE — Telephone Encounter (Signed)
Copied from [place]. Topic: Quick Communication - Patient Running Late >> Aug 06, 2017  2:12 PM Phillip Norman wrote: Patient called and is running few minutes late.    Route to department's PEC pool.

## 2017-08-09 DIAGNOSIS — R809 Proteinuria, unspecified: Secondary | ICD-10-CM | POA: Diagnosis not present

## 2017-08-09 DIAGNOSIS — R319 Hematuria, unspecified: Secondary | ICD-10-CM | POA: Diagnosis not present

## 2017-08-09 DIAGNOSIS — I10 Essential (primary) hypertension: Secondary | ICD-10-CM | POA: Diagnosis not present

## 2017-08-21 ENCOUNTER — Other Ambulatory Visit: Payer: Self-pay | Admitting: Family Medicine

## 2017-08-21 DIAGNOSIS — I10 Essential (primary) hypertension: Secondary | ICD-10-CM

## 2017-08-30 ENCOUNTER — Other Ambulatory Visit: Payer: Self-pay | Admitting: Family Medicine

## 2017-08-30 DIAGNOSIS — I10 Essential (primary) hypertension: Secondary | ICD-10-CM

## 2017-09-03 ENCOUNTER — Telehealth: Payer: Self-pay | Admitting: Family Medicine

## 2017-09-03 DIAGNOSIS — M79671 Pain in right foot: Secondary | ICD-10-CM

## 2017-09-03 NOTE — Telephone Encounter (Signed)
Pt saw NP on 06/24/17 for right foot pain (see note)

## 2017-09-03 NOTE — Telephone Encounter (Signed)
Copied from [place]. Topic: Referral - Request >> Sep 03, 2017  9:51 AM Corie Chiquito, [place] wrote: Reason for CRM: Patient would like to have a referral to the foot doctor. If someone could give him a call back about this at [phone]

## 2017-09-04 NOTE — Telephone Encounter (Signed)
Referral placed. Please confirm what issues he is still having to prompt this request. Thanks.

## 2017-09-06 NOTE — Telephone Encounter (Signed)
Noted.  Referral already placed.

## 2017-09-06 NOTE — Telephone Encounter (Signed)
C/O right foot pain off & of for a few month. No injury involved. It's mainly on the side of his foot that continues to bother him.

## 2017-11-17 ENCOUNTER — Encounter: Payer: Self-pay | Admitting: Family Medicine

## 2017-11-17 ENCOUNTER — Ambulatory Visit (INDEPENDENT_AMBULATORY_CARE_PROVIDER_SITE_OTHER): Payer: 59 | Admitting: Family Medicine

## 2017-11-17 ENCOUNTER — Ambulatory Visit (INDEPENDENT_AMBULATORY_CARE_PROVIDER_SITE_OTHER): Payer: 59

## 2017-11-17 VITALS — BP 152/90 | HR 80 | Temp 98.2°F | Ht 74.5 in | Wt 209.8 lb

## 2017-11-17 DIAGNOSIS — S99921A Unspecified injury of right foot, initial encounter: Secondary | ICD-10-CM | POA: Diagnosis not present

## 2017-11-17 DIAGNOSIS — E119 Type 2 diabetes mellitus without complications: Secondary | ICD-10-CM | POA: Diagnosis not present

## 2017-11-17 DIAGNOSIS — E785 Hyperlipidemia, unspecified: Secondary | ICD-10-CM | POA: Diagnosis not present

## 2017-11-17 DIAGNOSIS — I10 Essential (primary) hypertension: Secondary | ICD-10-CM | POA: Diagnosis not present

## 2017-11-17 DIAGNOSIS — M79671 Pain in right foot: Secondary | ICD-10-CM | POA: Diagnosis not present

## 2017-11-17 MED ORDER — AMLODIPINE BESYLATE 10 MG PO TABS: 10.0000 mg | ORAL_TABLET | Freq: Every day | ORAL | 3 refills | Status: DC

## 2017-11-17 NOTE — Assessment & Plan Note (Addendum)
Continue metformin.  Check lab work.  Foot exam completed.  He will make sure his eye doctor since the report of his visit next month.

## 2017-11-17 NOTE — Progress Notes (Signed)
Phillip Rumps, MD Phone: [phone]  Phillip Norman is a 47 y.o. male who presents today for f/u.  CC: htn, dm, right foot pain  HYPERTENSION  Disease Monitoring  Home BP Monitoring 125-130/80s Chest pain- no    Dyspnea- no Medications  Compliance-  Taking amlodipine, lisinopril.   Edema- no  DIABETES Disease Monitoring: Blood Sugar ranges-120s Polyuria/phagia/dipsia- one additional time at night, though he is drinking more water      Optho- see's next month Medications: Compliance- taking metformin Hypoglycemic symptoms- no  Right foot pain: Patient notes this has been going on for months.  He was never contacted by podiatry.  Started out with discomfort near his fifth MTP joint.  That side of his foot would swell some.  Subsequently he has developed discomfort in the middle portion of his arch on the right foot.  This will swell some as well.  He does note an injury to that area about 3 weeks ago when he landed wrong playing basketball.  He continues to play basketball and go to the gym and run.   Social History   Tobacco Use  Smoking Status Never Smoker  Smokeless Tobacco Never Used     ROS see history of present illness  Objective  Physical Exam Vitals:   11/17/17 1535  BP: (!) 152/90  Pulse: 80  Temp: 98.2 F (36.8 C)  SpO2: 98%    BP Readings from Last 3 Encounters:  11/17/17 (!) 152/90  06/24/17 134/82  06/04/17 132/70   Wt Readings from Last 3 Encounters:  11/17/17 209 lb 12.8 oz (95.2 kg)  06/24/17 203 lb (92.1 kg)  06/04/17 206 lb 6.4 oz (93.6 kg)    Physical Exam  Constitutional: No distress.  Cardiovascular: Normal rate, regular rhythm and normal heart sounds.  Pulmonary/Chest: Effort normal and breath sounds normal.  Musculoskeletal: He exhibits no edema.  Neurological: He is alert.  Skin: Skin is warm and dry. He is not diaphoretic.   Diabetic Foot Exam - Simple   Simple Foot Form Diabetic Foot exam was performed with the following  findings:  Yes 11/17/2017  3:45 PM  Visual Inspection No deformities, no ulcerations, no other skin breakdown bilaterally:  Yes Sensation Testing Intact to touch and monofilament testing bilaterally:  Yes Pulse Check Posterior Tibialis and Dorsalis pulse intact bilaterally:  Yes Comments Patient with slight tenderness over the distal portion of the fifth metatarsal and MTP joint, no swelling in this area, no warmth or erythema, also has tenderness in the midportion of his longitudinal arch with no obvious bony defects, no navicular tenderness or fifth head of metatarsal tenderness     Assessment/Plan: Please see individual problem list.  Essential hypertension Elevated today though has been well controlled.  Discussed returning in 2 weeks for BP check with nurse and bringing his cuff and to ensure that they match up.  Continue current regimen.  Diabetes mellitus type 2, controlled ([place]) Continue metformin.  Check lab work.  Foot exam completed.  He will make sure his eye doctor since the report of his visit next month.  Right foot pain Undetermined cause.  We will obtain an x-ray today.  I encouraged him to rest it.  We will place the referral to podiatry again.   Orders Placed This Encounter  Procedures  . DG Foot Complete Right    Standing Status:   Future    Number of Occurrences:   1    Standing Expiration Date:   01/18/2019  Order Specific Question:   Reason for Exam (SYMPTOM  OR DIAGNOSIS REQUIRED)    Answer:   Right foot pain for a number of months, injury about 3 weeks ago with discomfort over the medial arch    Order Specific Question:   Preferred imaging location?    Answer:   [facility] Specific Question:   Radiology Contrast Protocol - do NOT remove file path    Answer:   \\[email or web address].pdf  . Comp Met (CMET)  . HgB A1c  . Lipid panel  . Ambulatory referral to Podiatry    Referral Priority:   Routine      Referral Type:   Consultation    Referral Reason:   Specialty Services Required    Requested Specialty:   Podiatry    Number of Visits Requested:   1    Meds ordered this encounter  Medications  . amLODipine (NORVASC) 10 MG tablet    Sig: Take 1 tablet (10 mg total) by mouth daily.    Dispense:  90 tablet    Refill:  [place], MD [place]

## 2017-11-17 NOTE — Assessment & Plan Note (Signed)
Elevated today though has been well controlled.  Discussed returning in 2 weeks for BP check with nurse and bringing his cuff and to ensure that they match up.  Continue current regimen.

## 2017-11-17 NOTE — Patient Instructions (Signed)
Nice to see you. We will get an x-ray today and lab work.  We will contact you with the results. Please continue your blood pressure medications and diabetic medication.

## 2017-11-17 NOTE — Assessment & Plan Note (Signed)
Undetermined cause.  We will obtain an x-ray today.  I encouraged him to rest it.  We will place the referral to podiatry again.

## 2017-11-18 LAB — HEMOGLOBIN A1C: HEMOGLOBIN A1C: 7.6 % — AB (ref 4.6–6.5)

## 2017-11-18 LAB — COMPREHENSIVE METABOLIC PANEL
ALT: 9 U/L (ref 0–53)
AST: 15 U/L (ref 0–37)
Albumin: 4.5 g/dL (ref 3.5–5.2)
Alkaline Phosphatase: 52 U/L (ref 39–117)
BUN: 15 mg/dL (ref 6–23)
CALCIUM: 9.8 mg/dL (ref 8.4–10.5)
CHLORIDE: 106 meq/L (ref 96–112)
CO2: 28 mEq/L (ref 19–32)
CREATININE: 1.06 mg/dL (ref 0.40–1.50)
GFR: 96.12 mL/min (ref >60.00)
Glucose, Bld: 138 mg/dL — ABNORMAL HIGH (ref 70–99)
POTASSIUM: 4.1 meq/L (ref 3.5–5.1)
Sodium: 143 mEq/L (ref 135–145)
Total Bilirubin: 0.3 mg/dL (ref 0.2–1.2)
Total Protein: 7.3 g/dL (ref 6.0–8.3)

## 2017-11-18 LAB — LIPID PANEL
CHOLESTEROL: 192 mg/dL (ref 0–200)
HDL: 56.5 mg/dL (ref >39.00)
LDL CALC: 107 mg/dL — AB (ref 0–99)
NonHDL: 135.58
TRIGLYCERIDES: 143 mg/dL (ref 0.0–149.0)
Total CHOL/HDL Ratio: 3
VLDL: 28.6 mg/dL (ref 0.0–40.0)

## 2017-12-03 ENCOUNTER — Ambulatory Visit: Payer: 59 | Admitting: Family Medicine

## 2017-12-16 DIAGNOSIS — R809 Proteinuria, unspecified: Secondary | ICD-10-CM | POA: Diagnosis not present

## 2017-12-16 DIAGNOSIS — E1129 Type 2 diabetes mellitus with other diabetic kidney complication: Secondary | ICD-10-CM | POA: Diagnosis not present

## 2017-12-16 DIAGNOSIS — I10 Essential (primary) hypertension: Secondary | ICD-10-CM | POA: Diagnosis not present

## 2017-12-22 DIAGNOSIS — M722 Plantar fascial fibromatosis: Secondary | ICD-10-CM | POA: Diagnosis not present

## 2017-12-22 DIAGNOSIS — M779 Enthesopathy, unspecified: Secondary | ICD-10-CM | POA: Diagnosis not present

## 2017-12-22 DIAGNOSIS — Q6689 Other  specified congenital deformities of feet: Secondary | ICD-10-CM | POA: Diagnosis not present

## 2018-01-19 DIAGNOSIS — Q6689 Other  specified congenital deformities of feet: Secondary | ICD-10-CM | POA: Diagnosis not present

## 2018-01-19 DIAGNOSIS — M779 Enthesopathy, unspecified: Secondary | ICD-10-CM | POA: Diagnosis not present

## 2018-01-19 DIAGNOSIS — M722 Plantar fascial fibromatosis: Secondary | ICD-10-CM | POA: Diagnosis not present

## 2018-02-08 ENCOUNTER — Encounter: Payer: Self-pay | Admitting: Family Medicine

## 2018-02-08 ENCOUNTER — Ambulatory Visit (INDEPENDENT_AMBULATORY_CARE_PROVIDER_SITE_OTHER): Payer: 59 | Admitting: Family Medicine

## 2018-02-08 VITALS — BP 140/70 | HR 80 | Temp 98.2°F | Ht 74.5 in | Wt 210.6 lb

## 2018-02-08 DIAGNOSIS — R809 Proteinuria, unspecified: Secondary | ICD-10-CM

## 2018-02-08 DIAGNOSIS — E119 Type 2 diabetes mellitus without complications: Secondary | ICD-10-CM

## 2018-02-08 DIAGNOSIS — Z23 Encounter for immunization: Secondary | ICD-10-CM | POA: Diagnosis not present

## 2018-02-08 DIAGNOSIS — I10 Essential (primary) hypertension: Secondary | ICD-10-CM

## 2018-02-08 DIAGNOSIS — Z114 Encounter for screening for human immunodeficiency virus [HIV]: Secondary | ICD-10-CM | POA: Diagnosis not present

## 2018-02-08 DIAGNOSIS — N529 Male erectile dysfunction, unspecified: Secondary | ICD-10-CM

## 2018-02-08 MED ORDER — TADALAFIL 10 MG PO TABS: 10.0000 mg | ORAL_TABLET | Freq: Every day | ORAL | 0 refills | Status: DC | PRN

## 2018-02-08 NOTE — Assessment & Plan Note (Signed)
Patient has been evaluated by nephrology.  He will continue to follow with them.

## 2018-02-08 NOTE — Progress Notes (Signed)
[name], MD Phone: [phone]  Phillip Norman is a 47 y.o. male who presents today for f/u.  CC: dm, htn, proteinuria, ED  DIABETES Disease Monitoring: Blood Sugar ranges-135 avg Polyuria/phagia/dipsia- no      Optho- needs to see. Patient will schedule Medications: Compliance- taking metformin Hypoglycemic symptoms- no  HYPERTENSION  Disease Monitoring  Home BP Monitoring 135/79 Chest pain- no    Dyspnea- no Medications  Compliance-  Taking amlodipine, lisinopril.   Edema- minimal if stands for too long. Goes down with propping his legs up. No orthopnea or PND.   Proteinuria: Patient notes he saw nephrology and they advised him on yearly follow-up.  Erectile dysfunction: Patient notes he has had issues with this going as far back as 2012.  His prior physician prescribed Viagra which he would cut into quarters to equal 25 mg.  He notes issues maintaining an erection.  No issues ejaculating.  Occasional morning erections while sleeping.  Does note some stress though no anxiety or depression.  He notes no pain with erections.  He has been taking leftover Viagra 25 mg to help with erections and this has been beneficial.  He does note mild headache with this.   Social History   Tobacco Use  Smoking Status Never Smoker  Smokeless Tobacco Never Used     ROS see history of present illness  Objective  Physical Exam Vitals:   02/08/18 0836  BP: 140/70  Pulse: 80  Temp: 98.2 F (36.8 C)  SpO2: 95%    BP Readings from Last 3 Encounters:  02/08/18 140/70  11/17/17 (!) 152/90  06/24/17 134/82   Wt Readings from Last 3 Encounters:  02/08/18 210 lb 9.6 oz (95.5 kg)  11/17/17 209 lb 12.8 oz (95.2 kg)  06/24/17 203 lb (92.1 kg)    Physical Exam  Constitutional: No distress.  Cardiovascular: Normal rate, regular rhythm and normal heart sounds.  Pulmonary/Chest: Effort normal and breath sounds normal.  Genitourinary:  Genitourinary Comments: Normal circumcised  penis, normal scrotum, normal testicles, normal epididymis, normal vas deferens, no inguinal hernia  Musculoskeletal: He exhibits no edema.  Neurological: He is alert.  Skin: Skin is warm and dry. He is not diaphoretic.     Assessment/Plan: Please see individual problem list.  Essential hypertension Relatively well-controlled at home.  He will continue to work on diet and exercise.  Continue his current regimen.  Slight edema likely related to amlodipine.  Diabetes mellitus type 2, controlled ([place]) Not quite due for an A1c.  He will continue his metformin.  Return in 1 week for labs.  Proteinuria Patient has been evaluated by nephrology.  He will continue to follow with them.  Erectile dysfunction Chronic intermittent issue.  Normal genital exam.  He has tried Viagra which is beneficial though causes mild headache.  Will trial Cialis to see if he tolerates this better.  He was advised that if he has to be evaluated in the emergency room or call EMS he should inform them he has been taking the Cialis.  If he develops chest pain or difficulty breathing while sexually active he will be evaluated.   Orders Placed This Encounter  Procedures  . Flu Vaccine QUAD 6+ mos PF IM (Fluarix Quad PF)  . HgB A1c    Standing Status:   Future    Standing Expiration Date:   02/09/2019  . HIV antibody (with reflex)    Standing Status:   Future    Standing Expiration Date:   02/09/2019  Meds ordered this encounter  Medications  . tadalafil (CIALIS) 10 MG tablet    Sig: Take 1 tablet (10 mg total) by mouth daily as needed for erectile dysfunction.    Dispense:  5 tablet    Refill:  0     [name], MD [place]

## 2018-02-08 NOTE — Assessment & Plan Note (Signed)
Chronic intermittent issue.  Normal genital exam.  He has tried Viagra which is beneficial though causes mild headache.  Will trial Cialis to see if he tolerates this better.  He was advised that if he has to be evaluated in the emergency room or call EMS he should inform them he has been taking the Cialis.  If he develops chest pain or difficulty breathing while sexually active he will be evaluated.

## 2018-02-08 NOTE — Assessment & Plan Note (Signed)
Not quite due for an A1c.  He will continue his metformin.  Return in 1 week for labs.

## 2018-02-08 NOTE — Assessment & Plan Note (Addendum)
Relatively well-controlled at home.  He will continue to work on diet and exercise.  Continue his current regimen.  Slight edema likely related to amlodipine.

## 2018-02-08 NOTE — Patient Instructions (Signed)
Nice to see you. We will try Cialis for erectile dysfunction.  If this gives you a headache please let us know.  You should not take more than 1 pill daily.  If you have to go to the emergency room or call EMS for any reason you need to let them know you have been taking this medication. We will have you return for labs.

## 2018-02-14 DIAGNOSIS — E119 Type 2 diabetes mellitus without complications: Secondary | ICD-10-CM | POA: Diagnosis not present

## 2018-02-14 LAB — HM DIABETES EYE EXAM

## 2018-02-15 ENCOUNTER — Telehealth: Payer: Self-pay | Admitting: Family Medicine

## 2018-02-15 NOTE — Telephone Encounter (Signed)
rx change request

## 2018-02-15 NOTE — Telephone Encounter (Signed)
Copied from [place] [id number]. Topic: Quick Communication - Rx Refill/Question >> Feb 15, 2018  3:01 PM Margot Ables wrote: Medication: tadalafil (CIALIS) 10 MG tablet - pt states this medication didn't seem to work effectively - is there an alternative medication  Has the patient contacted their pharmacy? No   Preferred Pharmacy (with phone number or street name): [id number] [address], [place] [phone] (Phone) [phone] (Fax)

## 2018-02-16 NOTE — Telephone Encounter (Signed)
Sent to PCP to consistor another option

## 2018-02-17 NOTE — Telephone Encounter (Signed)
We could potentially try a higher dose.  Please find out if he had any side effects when taking this dose such as headache.  We could also refer to urology to consider other treatments.

## 2018-02-17 NOTE — Telephone Encounter (Signed)
Called and spoke with pt. He was hoping that you could send in Rx for Viagra  50 MG. Pt stated that he took this prior and it seemed to work great for him and he had no side effects.   Pt is not having side effects with Cialis the medication is just is NOT working for him.   Plus he had a concern with LISINOPRIL. Pt has done some researcher and would like another medication alternative pt is NOT comfortable taking LISINOPRIL. Pt denied having any side effects with the medication.   Sent to PCP

## 2018-02-19 ENCOUNTER — Other Ambulatory Visit: Payer: Self-pay | Admitting: Family Medicine

## 2018-02-21 ENCOUNTER — Other Ambulatory Visit (INDEPENDENT_AMBULATORY_CARE_PROVIDER_SITE_OTHER): Payer: 59

## 2018-02-21 ENCOUNTER — Other Ambulatory Visit: Payer: Self-pay | Admitting: Family Medicine

## 2018-02-21 DIAGNOSIS — E119 Type 2 diabetes mellitus without complications: Secondary | ICD-10-CM

## 2018-02-21 DIAGNOSIS — I10 Essential (primary) hypertension: Secondary | ICD-10-CM

## 2018-02-21 DIAGNOSIS — Z114 Encounter for screening for human immunodeficiency virus [HIV]: Secondary | ICD-10-CM

## 2018-02-21 NOTE — Addendum Note (Signed)
Addended by: Arby Barrette on: 02/21/2018 09:29 AM   Modules accepted: Orders

## 2018-02-21 NOTE — Telephone Encounter (Signed)
Last OV 02/08/2018

## 2018-02-22 LAB — HEMOGLOBIN A1C
HEMOGLOBIN A1C: 6.6 %{Hb} — AB (ref <5.7)
MEAN PLASMA GLUCOSE: 143 (calc)
eAG (mmol/L): 7.9 (calc)

## 2018-02-22 LAB — HIV ANTIBODY (ROUTINE TESTING W REFLEX): HIV 1&2 Ab, 4th Generation: NONREACTIVE

## 2018-02-22 MED ORDER — SILDENAFIL CITRATE 50 MG PO TABS: 50.0000 mg | ORAL_TABLET | Freq: Every day | ORAL | 0 refills | Status: DC | PRN

## 2018-02-22 NOTE — Telephone Encounter (Signed)
Called and spoke with pt. Pt advised and voiced understand. Pt stated that he did research on lisinopril on [employer] pt stated that he would need to print off the common side effects from the medication and will inform Dr. Caryl Bis of this issues he is having.   Will hold until we hear back from patient.   Pt stated that he will call back.

## 2018-02-22 NOTE — Telephone Encounter (Signed)
Viagra sent to the pharmacy.  Please ask what his concerns are regarding lisinopril.  He has been on this for a number of years.  I am happy to switch him to something different though I would like to understand his concerns so I can appropriately choose a medication for him.

## 2018-03-31 ENCOUNTER — Other Ambulatory Visit: Payer: Self-pay | Admitting: Family Medicine

## 2018-03-31 MED ORDER — SILDENAFIL CITRATE 50 MG PO TABS: 50.0000 mg | ORAL_TABLET | Freq: Every day | ORAL | 0 refills | Status: DC | PRN

## 2018-03-31 NOTE — Telephone Encounter (Signed)
Copied from [place] [id number]. Topic: Quick Communication - Rx Refill/Question >> Mar 31, 2018 10:36 AM Waylan Rocher, Lumin L wrote: Medication: sildenafil (VIAGRA) 50 MG tablet (would like a 30 day supply)  Has the patient contacted their pharmacy? Yes.   (Agent: If no, request that the patient contact the pharmacy for the refill.) (Agent: If yes, when and what did the pharmacy advise?)  Preferred Pharmacy (with phone number or street name): [id number] [place] [address] [place] [address] Phone: [phone] Fax: [phone]  Agent: Please be advised that RX refills may take up to 3 business days. We ask that you follow-up with your pharmacy.

## 2018-05-01 DIAGNOSIS — R197 Diarrhea, unspecified: Secondary | ICD-10-CM | POA: Diagnosis not present

## 2018-05-01 DIAGNOSIS — R109 Unspecified abdominal pain: Secondary | ICD-10-CM | POA: Diagnosis not present

## 2018-05-01 DIAGNOSIS — R112 Nausea with vomiting, unspecified: Secondary | ICD-10-CM | POA: Diagnosis not present

## 2018-05-02 DIAGNOSIS — A082 Adenoviral enteritis: Secondary | ICD-10-CM | POA: Diagnosis not present

## 2018-05-02 DIAGNOSIS — R1110 Vomiting, unspecified: Secondary | ICD-10-CM | POA: Diagnosis not present

## 2018-05-02 DIAGNOSIS — E860 Dehydration: Secondary | ICD-10-CM | POA: Diagnosis not present

## 2018-06-03 ENCOUNTER — Ambulatory Visit (INDEPENDENT_AMBULATORY_CARE_PROVIDER_SITE_OTHER): Payer: 59 | Admitting: Family Medicine

## 2018-06-03 ENCOUNTER — Encounter: Payer: Self-pay | Admitting: Family Medicine

## 2018-06-03 DIAGNOSIS — J0110 Acute frontal sinusitis, unspecified: Secondary | ICD-10-CM

## 2018-06-03 MED ORDER — AMOXICILLIN-POT CLAVULANATE 875-125 MG PO TABS: 1.0000 | ORAL_TABLET | Freq: Two times a day (BID) | ORAL | 0 refills | Status: DC

## 2018-06-03 NOTE — Patient Instructions (Signed)
Rest, fluids, start augmentin.  Try to rest your voice.   If lightheaded, then skip a dose of amlodipine.  Take care.  Glad to see you.

## 2018-06-03 NOTE — Progress Notes (Signed)
duration of symptoms: ~5-6 days Started with cough Rhinorrhea: yes Congestion: yes ear pain: minimal sore throat: yes Cough: yes with discolored sputum.   Myalgias: slightly Gradually worse in the meantime.  Sleep disrupted.   No vomiting, no diarrhea.  No rash.   Frontal facial pain.    Per HPI unless specifically indicated in ROS section  Meds, vitals, and allergies reviewed.   GEN: nad, alert and oriented HEENT: mucous membranes moist, TM w/o erythema, nasal epithelium injected, OP with cobblestoning, voice is slightly hoarse NECK: supple w/o LA CV: rrr. PULM: ctab, no inc wob ABD: soft, +bs EXT: well perfused.   Frontal > maxillary sinus tenderness.

## 2018-06-05 DIAGNOSIS — J0110 Acute frontal sinusitis, unspecified: Secondary | ICD-10-CM | POA: Insufficient documentation

## 2018-06-05 NOTE — Assessment & Plan Note (Signed)
Nontoxic.  Okay for outpatient follow-up.  Discussed with patient about options. Would treat given the duration, the exam, history of diabetes. Rest, fluids, start augmentin.  Try to rest voice.   If lightheaded, then skip a dose of amlodipine.  Update [place] as needed.  Routed to PCP as FYI.

## 2018-06-13 ENCOUNTER — Ambulatory Visit (INDEPENDENT_AMBULATORY_CARE_PROVIDER_SITE_OTHER): Payer: 59 | Admitting: Family Medicine

## 2018-06-13 ENCOUNTER — Encounter: Payer: Self-pay | Admitting: Family Medicine

## 2018-06-13 VITALS — BP 140/84 | HR 71 | Temp 98.1°F | Ht 74.5 in | Wt 210.6 lb

## 2018-06-13 DIAGNOSIS — Z8042 Family history of malignant neoplasm of prostate: Secondary | ICD-10-CM | POA: Diagnosis not present

## 2018-06-13 DIAGNOSIS — R809 Proteinuria, unspecified: Secondary | ICD-10-CM

## 2018-06-13 DIAGNOSIS — E119 Type 2 diabetes mellitus without complications: Secondary | ICD-10-CM

## 2018-06-13 DIAGNOSIS — I10 Essential (primary) hypertension: Secondary | ICD-10-CM | POA: Diagnosis not present

## 2018-06-13 DIAGNOSIS — J0110 Acute frontal sinusitis, unspecified: Secondary | ICD-10-CM

## 2018-06-13 LAB — POCT URINALYSIS DIPSTICK
Bilirubin, UA: NEGATIVE
Blood, UA: NEGATIVE
Glucose, UA: NEGATIVE
KETONES UA: NEGATIVE
Leukocytes, UA: NEGATIVE
NITRITE UA: NEGATIVE
PROTEIN UA: NEGATIVE
SPEC GRAV UA: 1.02 (ref 1.010–1.025)
UROBILINOGEN UA: 1 U/dL
pH, UA: 6.5 (ref 5.0–8.0)

## 2018-06-13 LAB — COMPREHENSIVE METABOLIC PANEL
ALBUMIN: 4.5 g/dL (ref 3.5–5.2)
ALK PHOS: 67 U/L (ref 39–117)
ALT: 6 U/L (ref 0–53)
AST: 11 U/L (ref 0–37)
BILIRUBIN TOTAL: 0.5 mg/dL (ref 0.2–1.2)
BUN: 8 mg/dL (ref 6–23)
CALCIUM: 10.1 mg/dL (ref 8.4–10.5)
CO2: 30 mEq/L (ref 19–32)
Chloride: 102 mEq/L (ref 96–112)
Creatinine, Ser: 0.97 mg/dL (ref 0.40–1.50)
GFR: 99.95 mL/min (ref >60.00)
GLUCOSE: 173 mg/dL — AB (ref 70–99)
Potassium: 3.7 mEq/L (ref 3.5–5.1)
Sodium: 141 mEq/L (ref 135–145)
TOTAL PROTEIN: 7.1 g/dL (ref 6.0–8.3)

## 2018-06-13 LAB — MICROALBUMIN / CREATININE URINE RATIO
Creatinine,U: 197.2 mg/dL
Microalb Creat Ratio: 0.9 mg/g (ref 0.0–30.0)
Microalb, Ur: 1.8 mg/dL (ref 0.0–1.9)

## 2018-06-13 LAB — HEMOGLOBIN A1C: Hgb A1c MFr Bld: 7.1 % — ABNORMAL HIGH (ref 4.6–6.5)

## 2018-06-13 LAB — PSA: PSA: 1.34 ng/mL (ref 0.10–4.00)

## 2018-06-13 MED ORDER — FLUTICASONE PROPIONATE 50 MCG/ACT NA SUSP: 2.0000 | Freq: Every day | NASAL | 6 refills | Status: AC

## 2018-06-13 NOTE — Assessment & Plan Note (Signed)
Seems to be mostly adequately controlled.  We will continue to monitor and if it trends up will go back to 10 mg of amlodipine daily.  Check lab work as outlined below.

## 2018-06-13 NOTE — Progress Notes (Signed)
[name], MD Phone: [phone]  Phillip Norman is a 48 y.o. male who presents today for follow-up.  CC: Sinusitis, hypertension, diabetes  Sinusitis: Patient was started on Augmentin about 9 days ago.  He notes his symptoms have been getting better though over the last day or so he started to feel little bit worse again.  He does note some continued sore throat and mild nasal congestion though no significant sinus congestion.  He does note postnasal drip.  He notes some sick contacts.  No over-the-counter medications.  He notes cough and nasal discharge with green mucus initially though turned clear and then now a little bit green again.  Hypertension: Typically 135 over 80s.  They decreased his amlodipine to 5 mg daily during his acute visit given his borderline low blood pressure.  He continues on lisinopril.  No chest pain or shortness of breath.  He has chronic mild lower extremity edema that comes and goes for some time now.  Diabetes: Typically around 125 when he does check it.  Taking metformin.  No polyuria or polydipsia.  No hypoglycemia.  He does follow with nephrology for diabetic nephropathy.  No frothy urine.  No hematuria.  Social History   Tobacco Use  Smoking Status Never Smoker  Smokeless Tobacco Never Used     ROS see history of present illness  Objective  Physical Exam Vitals:   06/13/18 0959  BP: 140/84  Pulse: 71  Temp: 98.1 F (36.7 C)  SpO2: 95%    BP Readings from Last 3 Encounters:  06/13/18 140/84  06/03/18 100/70  02/08/18 140/70   Wt Readings from Last 3 Encounters:  06/13/18 210 lb 9.6 oz (95.5 kg)  06/03/18 215 lb 12.8 oz (97.9 kg)  02/08/18 210 lb 9.6 oz (95.5 kg)    Physical Exam Constitutional:      General: He is not in acute distress.    Appearance: He is not diaphoretic.  HENT:     Head: Normocephalic and atraumatic.     Right Ear: Tympanic membrane normal.     Left Ear: Tympanic membrane normal.     Nose:   Comments: Right maxillary sinus mildly tender to percussion, no other sinus tenderness to percussion    Mouth/Throat:     Mouth: Mucous membranes are moist.     Pharynx: Oropharynx is clear.  Eyes:     Conjunctiva/sclera: Conjunctivae normal.     Pupils: Pupils are equal, round, and reactive to light.  Cardiovascular:     Rate and Rhythm: Normal rate and regular rhythm.     Heart sounds: Normal heart sounds.  Pulmonary:     Effort: Pulmonary effort is normal.     Breath sounds: Normal breath sounds.  Lymphadenopathy:     Cervical: No cervical adenopathy.  Skin:    General: Skin is warm and dry.  Neurological:     Mental Status: He is alert.      Assessment/Plan: Please see individual problem list.  Essential hypertension Seems to be mostly adequately controlled.  We will continue to monitor and if it trends up will go back to 10 mg of amlodipine daily.  Check lab work as outlined below.  Diabetes mellitus type 2, controlled ([place]) Check A1c.  Continue metformin.  Acute non-recurrent frontal sinusitis Patient has improved to some degree.  I did discuss changing his antibiotic although he was hesitant to do this.  He opted to start Flonase and finish his current course.  If he is not improving  further by Thursday he will contact us for doxycycline.  Given return precautions.  Family history of prostate cancer Check PSA.  Proteinuria Continue current medication.  Check urine studies.  Will request nephrology notes.   Orders Placed This Encounter  Procedures  . HgB A1c  . Comp Met (CMET)  . Urine Microalbumin w/creat. ratio  . PSA  . POCT Urinalysis Dipstick    Meds ordered this encounter  Medications  . fluticasone (FLONASE) 50 MCG/ACT nasal spray    Sig: Place 2 sprays into both nostrils daily.    Dispense:  16 g    Refill:  [place], MD [place]

## 2018-06-13 NOTE — Assessment & Plan Note (Signed)
Patient has improved to some degree.  I did discuss changing his antibiotic although he was hesitant to do this.  He opted to start Flonase and finish his current course.  If he is not improving further by Thursday he will contact us for doxycycline.  Given return precautions.

## 2018-06-13 NOTE — Assessment & Plan Note (Signed)
Check A1c.  Continue metformin. 

## 2018-06-13 NOTE — Patient Instructions (Signed)
Nice to see you. We will check lab work today and contact you with the results. Please try the Flonase and complete your antibiotics.  If your symptoms are not improving by Thursday please contact us for an alternative antibiotic prescription.

## 2018-06-13 NOTE — Assessment & Plan Note (Signed)
Check PSA. ?

## 2018-06-13 NOTE — Assessment & Plan Note (Signed)
Continue current medication.  Check urine studies.  Will request nephrology notes.

## 2018-07-08 ENCOUNTER — Telehealth: Payer: Self-pay

## 2018-07-08 ENCOUNTER — Other Ambulatory Visit: Payer: Self-pay

## 2018-07-08 NOTE — Telephone Encounter (Signed)
Pt stated that he wanted to stop the lisinopril due to swelling of his feet and pain felt in his feet. Sent to PCP to advise.

## 2018-07-08 NOTE — Telephone Encounter (Signed)
omeprazole prilosec 40 MG    Pt is request to have [facility]. Medication is not on pt's current medication list but has been refilled by Dr. Caryl Bis in the past per pt.   Sent to PCP for approval.    Last OV 06/13/2018

## 2018-07-08 NOTE — Telephone Encounter (Signed)
Copied from [place] [id number]. Topic: General - Other >> Jul 08, 2018  1:38 PM [place], Ja-Kwan wrote: Reason for CRM: Pt would like to change bp medication from lisinopril (PRINIVIL,ZESTRIL) 40 MG tablet to losartan. Pt has an appt scheduled for 10/19/18 but would like a call back to discuss medication change. Cb# [phone]

## 2018-07-09 NOTE — Telephone Encounter (Signed)
It does not appear that I filled this particular reflux medication in the past.  Please see how often he has been taking this.  Please see how long he has been on this medication for.  Please see if he is having any reflux symptoms.  Thanks.

## 2018-07-09 NOTE — Telephone Encounter (Signed)
Please get more details from the patient regarding the symptoms he describes.  How long have they been going on?  Has it been worsening?  What is he feels the lisinopril is the cause?  I am certainly happy to change the medication though those are not typical side effects from lisinopril.

## 2018-07-11 NOTE — Telephone Encounter (Signed)
Pt stated that he used to have issues with acid reflux in the past and this Rx was prescribed as needed by Owens Loffler MD.   Pt stated that for now he has just be using OTC medication but he is c/o stomach pain after eating and persistent burping through the day.   Sent to PCP to advise

## 2018-07-11 NOTE — Telephone Encounter (Signed)
Since BP is typically greater than goal, I'd recommend going with losartan 100 mg daily.

## 2018-07-11 NOTE — Telephone Encounter (Signed)
Called pt and left a VM to call back. CRM created and sent to [facility] pool. 

## 2018-07-11 NOTE — Telephone Encounter (Addendum)
Noted.  We can discuss those symptoms in the office at his next visit.  I will send a message to our clinical pharmacist to determine the appropriate dose to start out with his losartan.  He would need lab work about 1 week after making the change.  He would also need a blood pressure check 2 to 3 weeks after making the change.

## 2018-07-11 NOTE — Telephone Encounter (Signed)
Called and spoke with pt. Pt stated that he has been dealing with feet swelling, pain, and tiredness for almost 1 year or long symptoms come and go pt stated. However, he would ultimately  like to come off this medication and switch to the losartan.   Pt stated that he has done research and he just rather not take this medication any more.

## 2018-07-12 MED ORDER — OMEPRAZOLE 20 MG PO CPDR: 20.0000 mg | DELAYED_RELEASE_CAPSULE | Freq: Every day | ORAL | 3 refills | Status: DC

## 2018-07-12 NOTE — Telephone Encounter (Signed)
Called and spoke with pt. Pt advised pt stated that he just picked up an Rx for 90 day supply of lisinopril and plans to finish this Rx off first before he starts the new medication due to his insurance.   Plus pt wanted to know if you are willing to refill omeprazole?

## 2018-07-12 NOTE — Telephone Encounter (Signed)
Are you willing to refill this Rx or will pt need an OV?

## 2018-07-12 NOTE — Telephone Encounter (Signed)
Called pt and left a detailed VM. Advised pt to call back to scheduled for an appt. CRM created and sent to [facility] pool.

## 2018-07-12 NOTE — Addendum Note (Signed)
Addended by: Leone Haven on: 07/12/2018 02:43 PM   Modules accepted: Orders

## 2018-07-12 NOTE — Telephone Encounter (Signed)
I will refill the omeprazole though he should be seen in the office to discuss the symptoms that are described in his refill encounter.  Please offer him an appointment with me or Lauren for evaluation of those symptoms.  Thanks.

## 2018-07-12 NOTE — Telephone Encounter (Signed)
Please let the patient know that we can change him to losartan 100 mg daily. He will need labs 1 week after starting the new medication. I will send it in once you speak with him and place orders for labs. Thanks.

## 2018-07-12 NOTE — Telephone Encounter (Signed)
Refill sent to pharmacy previously.  He does need an office visit with me or Lauren regarding the symptoms he describes.

## 2018-09-30 ENCOUNTER — Other Ambulatory Visit: Payer: Self-pay | Admitting: Family Medicine

## 2018-10-19 ENCOUNTER — Other Ambulatory Visit: Payer: Self-pay

## 2018-10-19 ENCOUNTER — Telehealth: Payer: Self-pay | Admitting: Family Medicine

## 2018-10-19 ENCOUNTER — Ambulatory Visit (INDEPENDENT_AMBULATORY_CARE_PROVIDER_SITE_OTHER): Payer: 59 | Admitting: Family Medicine

## 2018-10-19 ENCOUNTER — Encounter: Payer: Self-pay | Admitting: Family Medicine

## 2018-10-19 DIAGNOSIS — Z9109 Other allergy status, other than to drugs and biological substances: Secondary | ICD-10-CM | POA: Diagnosis not present

## 2018-10-19 DIAGNOSIS — E119 Type 2 diabetes mellitus without complications: Secondary | ICD-10-CM | POA: Diagnosis not present

## 2018-10-19 DIAGNOSIS — I10 Essential (primary) hypertension: Secondary | ICD-10-CM

## 2018-10-19 DIAGNOSIS — R350 Frequency of micturition: Secondary | ICD-10-CM | POA: Diagnosis not present

## 2018-10-19 MED ORDER — LOSARTAN POTASSIUM 100 MG PO TABS: 100.0000 mg | ORAL_TABLET | Freq: Every day | ORAL | 3 refills | Status: DC

## 2018-10-19 NOTE — Telephone Encounter (Signed)
Please call the patient and get him scheduled for a lab appointment and a blood pressure check with nursing in 6 weeks.  Please get him scheduled for follow-up with me in 4 months.  Thanks.

## 2018-10-19 NOTE — Assessment & Plan Note (Signed)
I suspect symptoms are related to allergies given improvement with use of Flonase.  I encouraged him to use Flonase daily for the next month.  If his symptoms worsen or change or do not improve he will contact us.

## 2018-10-19 NOTE — Assessment & Plan Note (Signed)
Continue metformin.  He will have lab work in 6 weeks and we will check an A1c.

## 2018-10-19 NOTE — Assessment & Plan Note (Signed)
Slightly above goal.  We are going to switch him to losartan and he notes he will make this change in 5 weeks when he runs out of his lisinopril.  He will come in for lab work 7 to 10 days after that and he will have a BP check at that time.  He will continue amlodipine.

## 2018-10-19 NOTE — Assessment & Plan Note (Signed)
Potentially related to his diabetes.  Less likely related to a UTI given that this improved.  We will check his urine when he comes in for labs in 6 weeks.  If he develops recurrent symptoms prior to then he will let us know.

## 2018-10-19 NOTE — Progress Notes (Signed)
Virtual Visit via video Note  This visit type was conducted due to national recommendations for restrictions regarding the COVID-19 pandemic (e.g. social distancing).  This format is felt to be most appropriate for this patient at this time.  All issues noted in this document were discussed and addressed.  No physical exam was performed (except for noted visual exam findings with Video Visits).   I connected with Phillip Norman today at  2:45 PM EDT by a video enabled telemedicine application and verified that I am speaking with the correct person using two identifiers. Location patient: home Location provider: work Persons participating in the virtual visit: patient, provider  I discussed the limitations, risks, security and privacy concerns of performing an evaluation and management service by telephone and the availability of in person appointments. I also discussed with the patient that there may be a patient responsible charge related to this service. The patient expressed understanding and agreed to proceed.   Reason for visit: Follow-up.  HPI: Hypertension: Typically running 135/83.  Taking amlodipine 10 mg daily and lisinopril.  He is interested in changing the lisinopril as he thinks this may be contributing to achiness that he has had.  No chest pain or shortness of breath.  He has chronic stable minimal edema in his ankles.  No orthopnea or PND.  Diabetes: Typically runs around 120 fasting and 150 is 2 hours postprandial.  He does not check frequently.  He is taking metformin.  Possibly had some polyuria several weeks ago.  No polydipsia.  No hypoglycemia.  He does report he did have an episode where he got a little dizzy with some vertiginous symptoms and he checked his sugar and it was 190.  This lasted for a few minutes while he was laying down though resolved quickly and has not recurred.  No syncope.  Increased urinary frequency: Occurred several weeks ago and has not recurred in  the last week and a half.  He notes it felt as though he had an issue with his bladder.  He noted some dysuria.  Some increased urinary frequency though he would not have to go very much.  He felt like he had to urinate all the time though would not go at times.  No significant urgency.  No penile discharge.  He drank a lot of cranberry juice and the symptoms resolved over a week ago.  Allergies: Patient notes when he goes outside to work in the yard or take the trash out he feels as though his nose and airways tighten up a little bit.  He notes there is no issue when he is inside walking around.  No shortness of breath.  Some sneezing.  No cough or rhinorrhea.  No eye symptoms.  No COVID-19 exposure.  No fevers.  He does note when he takes his Flonase this prevents the symptoms.   ROS: See pertinent positives and negatives per HPI.  Past Medical History:  Diagnosis Date  . Chronic back pain   . Diabetes mellitus without complication ([place])   . Hyperlipidemia   . Hypertension     Past Surgical History:  Procedure Laterality Date  . TONSILLECTOMY AND ADENOIDECTOMY      Family History  Problem Relation Age of Onset  . Hypertension Mother   . Diabetes Father     SOCIAL HX: Non-smoker.   Current Outpatient Medications:  .  amLODipine (NORVASC) 10 MG tablet, Take 1 tablet (10 mg total) by mouth daily. (Patient taking differently: Take  5 mg by mouth daily. ), Disp: 90 tablet, Rfl: 3 .  fluticasone (FLONASE) 50 MCG/ACT nasal spray, Place 2 sprays into both nostrils daily., Disp: 16 g, Rfl: 6 .  metFORMIN (GLUCOPHAGE-XR) 500 MG 24 hr tablet, TAKE 4 TABLETS (2,000 MG TOTAL) BY MOUTH DAILY WITH BREAKFAST., Disp: 360 tablet, Rfl: 1 .  omeprazole (PRILOSEC) 20 MG capsule, Take 1 capsule (20 mg total) by mouth daily., Disp: 30 capsule, Rfl: 3 .  sildenafil (VIAGRA) 50 MG tablet, Take 1 tablet (50 mg total) by mouth daily as needed for erectile dysfunction., Disp: 30 tablet, Rfl: 0 .  losartan  (COZAAR) 100 MG tablet, Take 1 tablet (100 mg total) by mouth daily., Disp: 90 tablet, Rfl: 3  EXAM:  VITALS per patient if applicable: None.  GENERAL: alert, oriented, appears well and in no acute distress  HEENT: atraumatic, conjunttiva clear, no obvious abnormalities on inspection of external nose and ears  NECK: normal movements of the head and neck  LUNGS: on inspection no signs of respiratory distress, breathing rate appears normal, no obvious gross SOB, gasping or wheezing  CV: no obvious cyanosis  MS: moves all visible extremities without noticeable abnormality  PSYCH/NEURO: pleasant and cooperative, no obvious depression or anxiety, speech and thought processing grossly intact  ASSESSMENT AND PLAN:  Discussed the following assessment and plan:  Essential hypertension - Plan: Comp Met (CMET), Lipid panel  Environmental allergies  Controlled type 2 diabetes mellitus without complication, without long-term current use of insulin (HCC) - Plan: Hemoglobin A1c  Urinary frequency - Plan: POCT Urinalysis Dipstick  Essential hypertension Slightly above goal.  We are going to switch him to losartan and he notes he will make this change in 5 weeks when he runs out of his lisinopril.  He will come in for lab work 7 to 10 days after that and he will have a BP check at that time.  He will continue amlodipine.  Environmental allergies I suspect symptoms are related to allergies given improvement with use of Flonase.  I encouraged him to use Flonase daily for the next month.  If his symptoms worsen or change or do not improve he will contact us.  Diabetes mellitus type 2, controlled ([place]) Continue metformin.  He will have lab work in 6 weeks and we will check an A1c.  Urinary frequency Potentially related to his diabetes.  Less likely related to a UTI given that this improved.  We will check his urine when he comes in for labs in 6 weeks.  If he develops recurrent symptoms prior to  then he will let us know.  CMA will contact the patient to get him scheduled for lab work and follow-up.  Social distancing precautions and sick precautions given regarding COVID-19.   I discussed the assessment and treatment plan with the patient. The patient was provided an opportunity to ask questions and all were answered. The patient agreed with the plan and demonstrated an understanding of the instructions.   The patient was advised to call back or seek an in-person evaluation if the symptoms worsen or if the condition fails to improve as anticipated.   [name], MD

## 2018-12-12 ENCOUNTER — Other Ambulatory Visit: Payer: 59

## 2018-12-14 ENCOUNTER — Other Ambulatory Visit: Payer: 59

## 2018-12-14 ENCOUNTER — Ambulatory Visit: Payer: 59

## 2018-12-21 ENCOUNTER — Other Ambulatory Visit (INDEPENDENT_AMBULATORY_CARE_PROVIDER_SITE_OTHER): Payer: 59

## 2018-12-21 ENCOUNTER — Other Ambulatory Visit: Payer: Self-pay

## 2018-12-21 ENCOUNTER — Other Ambulatory Visit: Payer: Self-pay | Admitting: Family Medicine

## 2018-12-21 ENCOUNTER — Ambulatory Visit (INDEPENDENT_AMBULATORY_CARE_PROVIDER_SITE_OTHER): Payer: 59

## 2018-12-21 VITALS — BP 130/70 | HR 75

## 2018-12-21 DIAGNOSIS — R350 Frequency of micturition: Secondary | ICD-10-CM | POA: Diagnosis not present

## 2018-12-21 DIAGNOSIS — I10 Essential (primary) hypertension: Secondary | ICD-10-CM

## 2018-12-21 DIAGNOSIS — R8290 Unspecified abnormal findings in urine: Secondary | ICD-10-CM

## 2018-12-21 DIAGNOSIS — E119 Type 2 diabetes mellitus without complications: Secondary | ICD-10-CM | POA: Diagnosis not present

## 2018-12-21 LAB — POCT URINALYSIS DIPSTICK
Bilirubin, UA: NEGATIVE
Glucose, UA: NEGATIVE
Ketones, UA: NEGATIVE
Leukocytes, UA: NEGATIVE
Nitrite, UA: NEGATIVE
Protein, UA: NEGATIVE
Spec Grav, UA: 1.02 (ref 1.010–1.025)
Urobilinogen, UA: 0.2 E.U./dL
pH, UA: 6 (ref 5.0–8.0)

## 2018-12-21 LAB — COMPREHENSIVE METABOLIC PANEL
ALT: 8 U/L (ref 0–53)
AST: 11 U/L (ref 0–37)
Albumin: 4.5 g/dL (ref 3.5–5.2)
Alkaline Phosphatase: 62 U/L (ref 39–117)
BUN: 13 mg/dL (ref 6–23)
CO2: 29 mEq/L (ref 19–32)
Calcium: 10.1 mg/dL (ref 8.4–10.5)
Chloride: 102 mEq/L (ref 96–112)
Creatinine, Ser: 1.12 mg/dL (ref 0.40–1.50)
GFR: 84.48 mL/min (ref >60.00)
Glucose, Bld: 138 mg/dL — ABNORMAL HIGH (ref 70–99)
Potassium: 4.2 mEq/L (ref 3.5–5.1)
Sodium: 139 mEq/L (ref 135–145)
Total Bilirubin: 0.6 mg/dL (ref 0.2–1.2)
Total Protein: 6.8 g/dL (ref 6.0–8.3)

## 2018-12-21 LAB — LIPID PANEL
Cholesterol: 177 mg/dL (ref 0–200)
HDL: 51.4 mg/dL (ref >39.00)
LDL Cholesterol: 105 mg/dL — ABNORMAL HIGH (ref 0–99)
NonHDL: 125.66
Total CHOL/HDL Ratio: 3
Triglycerides: 102 mg/dL (ref 0.0–149.0)
VLDL: 20.4 mg/dL (ref 0.0–40.0)

## 2018-12-21 LAB — URINALYSIS, MICROSCOPIC ONLY: RBC / HPF: NONE SEEN (ref 0–?)

## 2018-12-21 LAB — HEMOGLOBIN A1C: Hgb A1c MFr Bld: 7.4 % — ABNORMAL HIGH (ref 4.6–6.5)

## 2018-12-21 NOTE — Addendum Note (Signed)
Addended by: Leeanne Rio on: 12/21/2018 12:21 PM   Modules accepted: Orders

## 2018-12-21 NOTE — Progress Notes (Signed)
Patient here for nurse visit BP check per MD order from phone note on 10/19/18.   Patient reports compliance with prescribed BP medications: YES  Last dose of BP medication: this morning  Patient denied having any symptoms.  BP Readings from Last 3 Encounters:  12/21/18 130/70  06/13/18 140/84  06/03/18 100/70   Pulse Readings from Last 3 Encounters:  12/21/18 75  06/13/18 71  06/03/18 88

## 2018-12-23 NOTE — Progress Notes (Signed)
I have reviewed the above note and agree.  Patient's blood pressure is adequately controlled.  He should continue with his current regimen.  [name], M.D.

## 2018-12-27 NOTE — Progress Notes (Signed)
Patient given advisement.  Pt voiced understanding and had no questions.

## 2019-02-08 ENCOUNTER — Ambulatory Visit: Payer: 59 | Admitting: Family Medicine

## 2019-02-09 ENCOUNTER — Other Ambulatory Visit: Payer: Self-pay | Admitting: Family Medicine

## 2019-03-16 ENCOUNTER — Other Ambulatory Visit: Payer: Self-pay | Admitting: Family Medicine

## 2019-03-17 ENCOUNTER — Other Ambulatory Visit: Payer: Self-pay | Admitting: Family Medicine

## 2019-03-30 ENCOUNTER — Other Ambulatory Visit: Payer: Self-pay | Admitting: Family Medicine

## 2019-06-25 ENCOUNTER — Other Ambulatory Visit: Payer: Self-pay | Admitting: Family Medicine

## 2019-06-26 ENCOUNTER — Other Ambulatory Visit: Payer: Self-pay | Admitting: Family Medicine

## 2019-07-26 ENCOUNTER — Ambulatory Visit (INDEPENDENT_AMBULATORY_CARE_PROVIDER_SITE_OTHER): Payer: 59 | Admitting: Family Medicine

## 2019-07-26 ENCOUNTER — Encounter: Payer: Self-pay | Admitting: Family Medicine

## 2019-07-26 ENCOUNTER — Other Ambulatory Visit: Payer: Self-pay

## 2019-07-26 VITALS — BP 130/80 | HR 95 | Temp 97.6°F | Ht 74.5 in | Wt 225.0 lb

## 2019-07-26 DIAGNOSIS — R079 Chest pain, unspecified: Secondary | ICD-10-CM | POA: Diagnosis not present

## 2019-07-26 DIAGNOSIS — I10 Essential (primary) hypertension: Secondary | ICD-10-CM

## 2019-07-26 DIAGNOSIS — Z8042 Family history of malignant neoplasm of prostate: Secondary | ICD-10-CM

## 2019-07-26 DIAGNOSIS — E119 Type 2 diabetes mellitus without complications: Secondary | ICD-10-CM | POA: Diagnosis not present

## 2019-07-26 DIAGNOSIS — R1012 Left upper quadrant pain: Secondary | ICD-10-CM

## 2019-07-26 NOTE — Assessment & Plan Note (Signed)
Check PSA. ?

## 2019-07-26 NOTE — Assessment & Plan Note (Addendum)
Patient with typical and atypical component.  EKG is similar to prior.  He is asymptomatic at this time.  Lab work as outlined below.  Refer to cardiology.  Advised if he has any recurrence he needs to go to the ED.

## 2019-07-26 NOTE — Assessment & Plan Note (Signed)
He will send [place] a message and let us know what medicine he is taking of his wife's.  Refer to GI.  Slight tenderness on exam.  Lab work to evaluate for elevated WBC or other abnormalities.

## 2019-07-26 NOTE — Assessment & Plan Note (Signed)
Check A1c.  Continue Metformin. 

## 2019-07-26 NOTE — Patient Instructions (Addendum)
Nice to see you. Please go to the lab at the hospital to have your labs drawn.  Please go to the ED for evaluation if you have recurrence of your pain. We will refer you to cardiology and GI.

## 2019-07-26 NOTE — Assessment & Plan Note (Signed)
At goal.  Swelling could be related to amlodipine.  We are going to get lab work and then consider altering his amlodipine given his swelling.

## 2019-07-26 NOTE — Progress Notes (Signed)
[name], MD Phone: [phone]  Phillip Norman is a 49 y.o. male who presents today for follow-up.  Hypertension: Not checking blood pressure.  Taking amlodipine and losartan.  Does report chest pain.  See below.  Does have some swelling in his bilateral feet.  His feet get up to 2-3 times the regular size though does go down overnight.  Chest pain: Patient notes this occurred 2 days ago.  He notes he had not slept well the night before and then had been sitting down at work for about 4 hours when he developed centralized chest discomfort that radiated to his left arm.  He had some shortness of breath with this.  No coughing or wheezing.  No diaphoresis.  No fevers.  Did have a little bit of back pain as well.  No COVID-19 exposure.  He notes no pain today.  He notes the pain previously lasted for several hours.  Left upper quadrant abdominal pain: Patient notes this has been going on for a while now and occurs after he eats.  Typically occurs after he eats anything.  He is taking his wife's medicine for her gastric ulcer and notes that has helped significantly.  No family history of gastric cancer.  No diarrhea.  Has a bowel movement every 2 to 3 days without significant straining.  He denies reflux symptoms.  No blood in stool.  No dysphagia.  Social History   Tobacco Use  Smoking Status Never Smoker  Smokeless Tobacco Never Used     ROS see history of present illness  Objective  Physical Exam Vitals:   07/26/19 1142  BP: 130/80  Pulse: 95  Temp: 97.6 F (36.4 C)  SpO2: 98%    BP Readings from Last 3 Encounters:  07/26/19 130/80  12/21/18 130/70  06/13/18 140/84   Wt Readings from Last 3 Encounters:  07/26/19 225 lb (102.1 kg)  06/13/18 210 lb 9.6 oz (95.5 kg)  06/03/18 215 lb 12.8 oz (97.9 kg)    Physical Exam Constitutional:      General: He is not in acute distress.    Appearance: He is not diaphoretic.  Cardiovascular:     Rate and Rhythm: Normal rate  and regular rhythm.     Heart sounds: Normal heart sounds.  Pulmonary:     Effort: Pulmonary effort is normal.     Breath sounds: Normal breath sounds.  Abdominal:     General: Bowel sounds are normal. There is no distension.     Palpations: Abdomen is soft.     Tenderness: There is abdominal tenderness (Left mid and upper quadrant). There is no guarding or rebound.  Skin:    General: Skin is warm and dry.  Neurological:     Mental Status: He is alert.    EKG: Normal sinus rhythm, rate 84, T wave abnormality in V5, V6, 1, and aVL that are stable compared to prior  Assessment/Plan: Please see individual problem list.  Essential hypertension At goal.  Swelling could be related to amlodipine.  We are going to get lab work and then consider altering his amlodipine given his swelling.  Chest pain Patient with typical and atypical component.  EKG is similar to prior.  He is asymptomatic at this time.  Lab work as outlined below.  Refer to cardiology.  Advised if he has any recurrence he needs to go to the ED.  Diabetes mellitus type 2, controlled ([place]) Check A1c.  Continue Metformin.  Family history of prostate cancer Check  PSA.  Left upper quadrant pain He will send [place] a message and let us know what medicine he is taking of his wife's.  Refer to GI.  Slight tenderness on exam.  Lab work to evaluate for elevated WBC or other abnormalities.   Orders Placed This Encounter  Procedures  . CBC    Standing Status:   Future    Standing Expiration Date:   07/25/2020  . Comp Met (CMET)    Standing Status:   Future    Standing Expiration Date:   07/25/2020  . Direct LDL    Standing Status:   Future    Standing Expiration Date:   07/25/2020  . HgB A1c    Standing Status:   Future    Standing Expiration Date:   07/25/2020  . PSA    Standing Status:   Future    Standing Expiration Date:   07/25/2020  . TSH    Standing Status:   Future    Standing Expiration Date:   07/25/2020  . Ambulatory  referral to Gastroenterology    Referral Priority:   Routine    Referral Type:   Consultation    Referral Reason:   Specialty Services Required    Number of Visits Requested:   1  . Ambulatory referral to Cardiology    Referral Priority:   Routine    Referral Type:   Consultation    Referral Reason:   Specialty Services Required    Requested Specialty:   Cardiology    Number of Visits Requested:   1  . EKG 12-Lead    No orders of the defined types were placed in this encounter.   This visit occurred during the SARS-CoV-2 public health emergency.  Safety protocols were in place, including screening questions prior to the visit, additional usage of staff PPE, and extensive cleaning of exam room while observing appropriate contact time as indicated for disinfecting solutions.    [name], MD [place];

## 2019-07-27 ENCOUNTER — Other Ambulatory Visit
Admission: RE | Admit: 2019-07-27 | Discharge: 2019-07-27 | Disposition: A | Discharge status: Home / Self Care | Payer: 59 | Source: Ambulatory Visit | Attending: Family Medicine | Admitting: Family Medicine

## 2019-07-27 DIAGNOSIS — R079 Chest pain, unspecified: Secondary | ICD-10-CM | POA: Insufficient documentation

## 2019-07-27 LAB — HEMOGLOBIN A1C
Hgb A1c MFr Bld: 7.9 % — ABNORMAL HIGH (ref 4.8–5.6)
Mean Plasma Glucose: 180.03 mg/dL

## 2019-07-27 LAB — COMPREHENSIVE METABOLIC PANEL
ALT: 10 U/L (ref 0–44)
AST: 13 U/L — ABNORMAL LOW (ref 15–41)
Albumin: 4.3 g/dL (ref 3.5–5.0)
Alkaline Phosphatase: 61 U/L (ref 38–126)
Anion gap: 7 (ref 5–15)
BUN: 13 mg/dL (ref 6–20)
CO2: 28 mmol/L (ref 22–32)
Calcium: 9.5 mg/dL (ref 8.9–10.3)
Chloride: 105 mmol/L (ref 98–111)
Creatinine, Ser: 0.84 mg/dL (ref 0.61–1.24)
GFR calc Af Amer: >60 mL/min (ref >60)
GFR calc non Af Amer: >60 mL/min (ref >60)
Glucose, Bld: 203 mg/dL — ABNORMAL HIGH (ref 70–99)
Potassium: 4.2 mmol/L (ref 3.5–5.1)
Sodium: 140 mmol/L (ref 135–145)
Total Bilirubin: 0.8 mg/dL (ref 0.3–1.2)
Total Protein: 7.2 g/dL (ref 6.5–8.1)

## 2019-07-27 LAB — CBC
HCT: 36 % — ABNORMAL LOW (ref 39.0–52.0)
Hemoglobin: 11.8 g/dL — ABNORMAL LOW (ref 13.0–17.0)
MCH: 27.9 pg (ref 26.0–34.0)
MCHC: 32.8 g/dL (ref 30.0–36.0)
MCV: 85.1 fL (ref 80.0–100.0)
Platelets: 268 10*3/uL (ref 150–400)
RBC: 4.23 MIL/uL (ref 4.22–5.81)
RDW: 14 % (ref 11.5–15.5)
WBC: 4.5 10*3/uL (ref 4.0–10.5)
nRBC: 0 % (ref 0.0–0.2)

## 2019-07-27 LAB — TSH: TSH: 2.663 u[IU]/mL (ref 0.350–4.500)

## 2019-07-27 LAB — PSA: Prostatic Specific Antigen: 1.09 ng/mL (ref 0.00–4.00)

## 2019-07-27 LAB — LDL CHOLESTEROL, DIRECT: Direct LDL: 124 mg/dL — ABNORMAL HIGH (ref 0–99)

## 2019-08-01 ENCOUNTER — Encounter: Payer: Self-pay | Admitting: *Deleted

## 2019-08-02 NOTE — Progress Notes (Signed)
I called and spoke with the patient and gave lab results. Patient stated he did not want the jardiance or the crestor he wants to try diet and exercise so he can bring all those numbers down. Patient stated he is not bleeding from anywhere.  He did schedule a lab appt in 2 weeks so you can order the future lab for him.  Torris House,cma

## 2019-08-13 ENCOUNTER — Other Ambulatory Visit: Payer: Self-pay | Admitting: Family Medicine

## 2019-08-13 DIAGNOSIS — D649 Anemia, unspecified: Secondary | ICD-10-CM

## 2019-08-15 ENCOUNTER — Other Ambulatory Visit: Payer: Self-pay

## 2019-08-15 ENCOUNTER — Other Ambulatory Visit (INDEPENDENT_AMBULATORY_CARE_PROVIDER_SITE_OTHER): Payer: 59

## 2019-08-15 DIAGNOSIS — D649 Anemia, unspecified: Secondary | ICD-10-CM | POA: Diagnosis not present

## 2019-08-15 LAB — CBC
HCT: 35 % — ABNORMAL LOW (ref 39.0–52.0)
Hemoglobin: 11.4 g/dL — ABNORMAL LOW (ref 13.0–17.0)
MCHC: 32.5 g/dL (ref 30.0–36.0)
MCV: 86 fl (ref 78.0–100.0)
Platelets: 271 10*3/uL (ref 150.0–400.0)
RBC: 4.07 Mil/uL — ABNORMAL LOW (ref 4.22–5.81)
RDW: 14.6 % (ref 11.5–15.5)
WBC: 5.1 10*3/uL (ref 4.0–10.5)

## 2019-08-16 ENCOUNTER — Other Ambulatory Visit: Payer: Self-pay | Admitting: Family Medicine

## 2019-08-16 DIAGNOSIS — D649 Anemia, unspecified: Secondary | ICD-10-CM

## 2019-08-18 ENCOUNTER — Encounter: Payer: Self-pay | Admitting: Family Medicine

## 2019-08-29 ENCOUNTER — Other Ambulatory Visit: Payer: Self-pay

## 2019-08-29 ENCOUNTER — Other Ambulatory Visit (INDEPENDENT_AMBULATORY_CARE_PROVIDER_SITE_OTHER): Payer: 59

## 2019-08-29 DIAGNOSIS — D649 Anemia, unspecified: Secondary | ICD-10-CM

## 2019-08-29 LAB — FOLATE: Folate: >23.7 ng/mL (ref >5.9)

## 2019-08-29 LAB — IBC + FERRITIN
Ferritin: 25.4 ng/mL (ref 22.0–322.0)
Iron: 83 ug/dL (ref 42–165)
Saturation Ratios: 24 % (ref 20.0–50.0)
Transferrin: 247 mg/dL (ref 212.0–360.0)

## 2019-08-29 LAB — VITAMIN B12: Vitamin B-12: 401 pg/mL (ref 211–911)

## 2019-09-05 ENCOUNTER — Other Ambulatory Visit: Payer: Self-pay | Admitting: Family Medicine

## 2019-09-05 DIAGNOSIS — D649 Anemia, unspecified: Secondary | ICD-10-CM

## 2019-09-15 ENCOUNTER — Other Ambulatory Visit (INDEPENDENT_AMBULATORY_CARE_PROVIDER_SITE_OTHER): Payer: 59

## 2019-09-15 ENCOUNTER — Other Ambulatory Visit: Payer: Self-pay

## 2019-09-15 DIAGNOSIS — D649 Anemia, unspecified: Secondary | ICD-10-CM | POA: Diagnosis not present

## 2019-09-15 LAB — CBC
HCT: 36.9 % — ABNORMAL LOW (ref 39.0–52.0)
Hemoglobin: 11.8 g/dL — ABNORMAL LOW (ref 13.0–17.0)
MCHC: 32 g/dL (ref 30.0–36.0)
MCV: 86.1 fl (ref 78.0–100.0)
Platelets: 266 10*3/uL (ref 150.0–400.0)
RBC: 4.29 Mil/uL (ref 4.22–5.81)
RDW: 14.6 % (ref 11.5–15.5)
WBC: 5.1 10*3/uL (ref 4.0–10.5)

## 2019-09-19 ENCOUNTER — Other Ambulatory Visit: Payer: Self-pay | Admitting: Family Medicine

## 2019-09-19 DIAGNOSIS — D649 Anemia, unspecified: Secondary | ICD-10-CM

## 2019-09-25 ENCOUNTER — Other Ambulatory Visit: Payer: Self-pay

## 2019-09-25 ENCOUNTER — Inpatient Hospital Stay: Payer: 59 | Attending: Oncology | Admitting: Oncology

## 2019-09-25 ENCOUNTER — Inpatient Hospital Stay: Payer: 59

## 2019-09-25 ENCOUNTER — Encounter: Payer: Self-pay | Admitting: Oncology

## 2019-09-25 VITALS — BP 143/91 | HR 76 | Temp 95.9°F | Resp 18 | Ht 74.5 in | Wt 227.8 lb

## 2019-09-25 DIAGNOSIS — Z803 Family history of malignant neoplasm of breast: Secondary | ICD-10-CM | POA: Insufficient documentation

## 2019-09-25 DIAGNOSIS — Z8249 Family history of ischemic heart disease and other diseases of the circulatory system: Secondary | ICD-10-CM | POA: Insufficient documentation

## 2019-09-25 DIAGNOSIS — E785 Hyperlipidemia, unspecified: Secondary | ICD-10-CM | POA: Diagnosis not present

## 2019-09-25 DIAGNOSIS — E119 Type 2 diabetes mellitus without complications: Secondary | ICD-10-CM | POA: Diagnosis not present

## 2019-09-25 DIAGNOSIS — D649 Anemia, unspecified: Secondary | ICD-10-CM | POA: Diagnosis not present

## 2019-09-25 DIAGNOSIS — Z833 Family history of diabetes mellitus: Secondary | ICD-10-CM | POA: Insufficient documentation

## 2019-09-25 DIAGNOSIS — I10 Essential (primary) hypertension: Secondary | ICD-10-CM | POA: Insufficient documentation

## 2019-09-25 DIAGNOSIS — N529 Male erectile dysfunction, unspecified: Secondary | ICD-10-CM | POA: Diagnosis not present

## 2019-09-25 DIAGNOSIS — Z7984 Long term (current) use of oral hypoglycemic drugs: Secondary | ICD-10-CM | POA: Diagnosis not present

## 2019-09-25 DIAGNOSIS — Z79899 Other long term (current) drug therapy: Secondary | ICD-10-CM | POA: Diagnosis not present

## 2019-09-25 DIAGNOSIS — R1030 Lower abdominal pain, unspecified: Secondary | ICD-10-CM | POA: Diagnosis not present

## 2019-09-25 DIAGNOSIS — Z8042 Family history of malignant neoplasm of prostate: Secondary | ICD-10-CM | POA: Insufficient documentation

## 2019-09-25 NOTE — Progress Notes (Signed)
Hematology/Oncology Consult note [facility] Telephone:([phone] [phone]   Patient Care Team: Leone Haven, MD as PCP - General (Family Medicine)  REFERRING PROVIDER: Leone Haven, MD  CHIEF COMPLAINTS/REASON FOR VISIT:  Evaluation of anemia  HISTORY OF PRESENTING ILLNESS:  Phillip Norman is a  49 y.o.  male with PMH listed below who was referred to me for evaluation of anemia Reviewed patient's recent labs that was done.  09/15/19 Labs revealed anemia with hemoglobin of 11.8, MCV86   Iron 83, transferrin 247, TIBC 24, ferritin 25.4 B12 401, folate >23.7 07/27/2019 TSH 2.663 Reviewed patient's previous labs ordered by primary care physician's office, anemia is new onset , duration is since March 2021 No aggravating or improving factors.  Associated signs and symptoms: Patient reports mild anemia, no significant breathing difficulties,  Denies weight loss, easy bruising, hematochezia, hemoptysis, hematuria. Context: History of GI bleeding: Denies               History of Chronic kidney disease: Denies               History of autoimmune disease: Denies               Last colonoscopy: Never had one. Patient reports left abdomen discomfort/pain, sharp in nature, usually triggered by eating.  No relieving factors.  Spontaneously resolved.  He feels bloated.  Family history positive for maternal aunt had a history of breast cancer.  Maternal uncle had a remote history of prostate cancer Review of Systems  Constitutional: Negative for appetite change, chills, fatigue, fever and unexpected weight change.  HENT:   Negative for hearing loss and voice change.   Eyes: Negative for eye problems and icterus.  Respiratory: Negative for chest tightness, cough and shortness of breath.   Cardiovascular: Negative for chest pain and leg swelling.  Gastrointestinal: Negative for abdominal distention and abdominal pain.  Endocrine: Negative for hot  flashes.  Genitourinary: Negative for difficulty urinating, dysuria and frequency.   Musculoskeletal: Negative for arthralgias.  Skin: Negative for itching and rash.  Neurological: Negative for light-headedness and numbness.  Hematological: Negative for adenopathy. Does not bruise/bleed easily.  Psychiatric/Behavioral: Negative for confusion.     MEDICAL HISTORY:  Past Medical History:  Diagnosis Date  . Chronic back pain   . Diabetes mellitus without complication ([place])   . Hyperlipidemia   . Hypertension     SURGICAL HISTORY: Past Surgical History:  Procedure Laterality Date  . TONSILLECTOMY AND ADENOIDECTOMY      SOCIAL HISTORY: Social History   Socioeconomic History  . Marital status: Married    Spouse name: Not on file  . Number of children: 1  . Years of education: Not on file  . Highest education level: Not on file  Occupational History  . Occupation: SENIOR FINANCE MANAGER  Tobacco Use  . Smoking status: Never Smoker  . Smokeless tobacco: Never Used  Substance and Sexual Activity  . Alcohol use: No  . Drug use: No  . Sexual activity: Not on file  Other Topics Concern  . Not on file  Social History Narrative   REGULAR EXERCISE-NO   MASTER LEVEL EDUCATION   Social Determinants of Health   Financial Resource Strain:   . Difficulty of Paying Living Expenses:   Food Insecurity:   . Worried About Charity fundraiser in the Last Year:   . Arboriculturist in the Last Year:   Transportation Needs:   . Lack  of Transportation (Medical):   Marland Kitchen Lack of Transportation (Non-Medical):   Physical Activity:   . Days of Exercise per Week:   . Minutes of Exercise per Session:   Stress:   . Feeling of Stress :   Social Connections:   . Frequency of Communication with Friends and Family:   . Frequency of Social Gatherings with Friends and Family:   . Attends Religious Services:   . Active Member of Clubs or Organizations:   . Attends Archivist Meetings:    Marland Kitchen Marital Status:   Intimate Partner Violence:   . Fear of Current or Ex-Partner:   . Emotionally Abused:   Marland Kitchen Physically Abused:   . Sexually Abused:     FAMILY HISTORY: Family History  Problem Relation Age of Onset  . Hypertension Mother   . Diabetes Father     ALLERGIES:  has No Known Allergies.  MEDICATIONS:  Current Outpatient Medications  Medication Sig Dispense Refill  . amLODipine (NORVASC) 10 MG tablet TAKE 1 TABLET BY MOUTH EVERY DAY 90 tablet 1  . fluticasone (FLONASE) 50 MCG/ACT nasal spray Place 2 sprays into both nostrils daily. 16 g 6  . losartan (COZAAR) 100 MG tablet Take 1 tablet (100 mg total) by mouth daily. 90 tablet 3  . metFORMIN (GLUCOPHAGE-XR) 500 MG 24 hr tablet TAKE 4 TABLETS BY MOUTH DAILY WITH BREAKFAST 360 tablet 0  . omeprazole (PRILOSEC) 20 MG capsule Take 1 capsule (20 mg total) by mouth daily. 30 capsule 3  . sildenafil (VIAGRA) 50 MG tablet TAKE 1 TABLET BY MOUTH DAILY AS NEEDED FOR ERECTILE DYSFUNCTION *INSUR MAX 24 PER 90 DAYS 14 tablet 2   No current facility-administered medications for this visit.     PHYSICAL EXAMINATION: ECOG PERFORMANCE STATUS: 0 - Asymptomatic Vitals:   09/25/19 1105  BP: (!) 143/91  Pulse: 76  Resp: 18  Temp: (!) 95.9 F (35.5 C)   There were no vitals filed for this visit.  Physical Exam Constitutional:      General: He is not in acute distress. HENT:     Head: Normocephalic and atraumatic.  Eyes:     General: No scleral icterus. Cardiovascular:     Rate and Rhythm: Normal rate and regular rhythm.     Heart sounds: Normal heart sounds.  Pulmonary:     Effort: Pulmonary effort is normal. No respiratory distress.     Breath sounds: No wheezing.  Abdominal:     General: Bowel sounds are normal. There is no distension.     Palpations: Abdomen is soft.  Musculoskeletal:        General: No deformity. Normal range of motion.     Cervical back: Normal range of motion and neck supple.  Skin:     General: Skin is warm and dry.     Findings: No erythema or rash.  Neurological:     Mental Status: He is alert and oriented to person, place, and time. Mental status is at baseline.     Cranial Nerves: No cranial nerve deficit.     Coordination: Coordination normal.  Psychiatric:        Mood and Affect: Mood normal.      LABORATORY DATA:  I have reviewed the data as listed Lab Results  Component Value Date   WBC 5.1 09/15/2019   HGB 11.8 (L) 09/15/2019   HCT 36.9 (L) 09/15/2019   MCV 86.1 09/15/2019   PLT 266.0 09/15/2019   Recent Labs  12/21/18 0932 07/27/19 0952  NA 139 140  K 4.2 4.2  CL 102 105  CO2 29 28  GLUCOSE 138* 203*  BUN 13 13  CREATININE 1.12 0.84  CALCIUM 10.1 9.5  GFRNONAA  --  >60  GFRAA  --  >60  PROT 6.8 7.2  ALBUMIN 4.5 4.3  AST 11 13*  ALT 8 10  ALKPHOS 62 61  BILITOT 0.6 0.8   Iron/TIBC/Ferritin/ %Sat    Component Value Date/Time   IRON 83 08/29/2019 0921   FERRITIN 25.4 08/29/2019 0921   IRONPCTSAT 24.0 08/29/2019 0921        ASSESSMENT & PLAN:  1. Normocytic anemia   2. Erectile dysfunction, unspecified erectile dysfunction type   3. Lower abdominal pain   Previous labs were reviewed and discussed with patient. -Patient has mild anemia with hemoglobin in mid 11's.  This is an new onset since March 2021.  His previous baseline was normal hemoglobin levels.  Dr. Biagio Quint has done anemia work-up which showed normal iron panel, folate and vitamin B12 levels.  TSH has also been checked previously and was normal. I will check CBC, smear, reticulocyte panel, ANA, Given her history of ED, I will check morning testosterone level as hypogonadism can be related to anemia as well.  Left abdominal pain, intermittent and chronic. Patient has been referred to Dr. Vicente Males for further evaluation.  Encourage patient to keep his appointment.  Orders Placed This Encounter  Procedures  . CBC with Differential/Platelet    Standing Status:    Future    Standing Expiration Date:   03/27/2021  . Comprehensive metabolic panel    Standing Status:   Future    Standing Expiration Date:   03/27/2021  . Retic Panel    Standing Status:   Future    Standing Expiration Date:   03/27/2021  . Technologist smear review    Standing Status:   Future    Standing Expiration Date:   03/27/2021  . ANA, IFA (with reflex)    Standing Status:   Future    Standing Expiration Date:   03/27/2021  . Testosterone    Standing Status:   Future    Standing Expiration Date:   09/24/2020    All questions were answered. The patient knows to call the clinic with any problems questions or concerns. Cc. Leone Haven, MD  Return of visit: To be determined Thank you for this kind referral and the opportunity to participate in the care of this patient. A copy of today's note is routed to referring provider      Earlie Server, MD, PhD 09/25/2019

## 2019-09-25 NOTE — Progress Notes (Signed)
Pt here to establish care. Pt reports having pain to left side of stomach after he eats.

## 2019-09-26 ENCOUNTER — Inpatient Hospital Stay: Payer: 59

## 2019-09-26 DIAGNOSIS — D649 Anemia, unspecified: Secondary | ICD-10-CM

## 2019-09-26 LAB — RETIC PANEL
Immature Retic Fract: 18.1 % — ABNORMAL HIGH (ref 2.3–15.9)
RBC.: 4.32 MIL/uL (ref 4.22–5.81)
Retic Count, Absolute: 70.4 10*3/uL (ref 19.0–186.0)
Retic Ct Pct: 1.6 % (ref 0.4–3.1)
Reticulocyte Hemoglobin: 29.9 pg (ref >27.9)

## 2019-09-26 LAB — CBC WITH DIFFERENTIAL/PLATELET
Abs Immature Granulocytes: 0.01 10*3/uL (ref 0.00–0.07)
Basophils Absolute: 0 10*3/uL (ref 0.0–0.1)
Basophils Relative: 1 %
Eosinophils Absolute: 0.1 10*3/uL (ref 0.0–0.5)
Eosinophils Relative: 2 %
HCT: 37.4 % — ABNORMAL LOW (ref 39.0–52.0)
Hemoglobin: 11.9 g/dL — ABNORMAL LOW (ref 13.0–17.0)
Immature Granulocytes: 0 %
Lymphocytes Relative: 44 %
Lymphs Abs: 2.1 10*3/uL (ref 0.7–4.0)
MCH: 27.2 pg (ref 26.0–34.0)
MCHC: 31.8 g/dL (ref 30.0–36.0)
MCV: 85.6 fL (ref 80.0–100.0)
Monocytes Absolute: 0.4 10*3/uL (ref 0.1–1.0)
Monocytes Relative: 9 %
Neutro Abs: 2.2 10*3/uL (ref 1.7–7.7)
Neutrophils Relative %: 44 %
Platelets: 266 10*3/uL (ref 150–400)
RBC: 4.37 MIL/uL (ref 4.22–5.81)
RDW: 14.3 % (ref 11.5–15.5)
WBC: 4.8 10*3/uL (ref 4.0–10.5)
nRBC: 0 % (ref 0.0–0.2)

## 2019-09-26 LAB — TECHNOLOGIST SMEAR REVIEW: Plt Morphology: ADEQUATE

## 2019-09-26 LAB — COMPREHENSIVE METABOLIC PANEL
ALT: 14 U/L (ref 0–44)
AST: 15 U/L (ref 15–41)
Albumin: 4.2 g/dL (ref 3.5–5.0)
Alkaline Phosphatase: 64 U/L (ref 38–126)
Anion gap: 9 (ref 5–15)
BUN: 10 mg/dL (ref 6–20)
CO2: 28 mmol/L (ref 22–32)
Calcium: 9 mg/dL (ref 8.9–10.3)
Chloride: 102 mmol/L (ref 98–111)
Creatinine, Ser: 0.9 mg/dL (ref 0.61–1.24)
GFR calc Af Amer: >60 mL/min (ref >60)
GFR calc non Af Amer: >60 mL/min (ref >60)
Glucose, Bld: 211 mg/dL — ABNORMAL HIGH (ref 70–99)
Potassium: 3.9 mmol/L (ref 3.5–5.1)
Sodium: 139 mmol/L (ref 135–145)
Total Bilirubin: 0.6 mg/dL (ref 0.3–1.2)
Total Protein: 7.2 g/dL (ref 6.5–8.1)

## 2019-09-27 ENCOUNTER — Other Ambulatory Visit: Payer: Self-pay | Admitting: Family Medicine

## 2019-09-27 LAB — TESTOSTERONE: Testosterone: 298 ng/dL (ref 264–916)

## 2019-09-28 LAB — ANTINUCLEAR ANTIBODIES, IFA: ANA Ab, IFA: POSITIVE — AB

## 2019-09-28 LAB — FANA STAINING PATTERNS: Homogeneous Pattern: 1:80 {titer}

## 2019-10-02 ENCOUNTER — Telehealth: Payer: Self-pay

## 2019-10-02 NOTE — Telephone Encounter (Signed)
Done....   MD Only to discuss lab results per Dr. Tasia Catchings appt has been sched as requested.. Nx week appt date per pt request due to having a busy schedule. Pt requested a MyChart Visit.

## 2019-10-02 NOTE — Telephone Encounter (Signed)
-----   Message from Earlie Server, MD sent at 10/02/2019  1:09 PM EDT ----- Please schedule him to do a virtual visit for discussion of blood work thanks. In person ok if he prefers

## 2019-10-02 NOTE — Telephone Encounter (Signed)
Schedule MD appt to discuss lab results.

## 2019-10-10 ENCOUNTER — Inpatient Hospital Stay (HOSPITAL_BASED_OUTPATIENT_CLINIC_OR_DEPARTMENT_OTHER): Payer: 59 | Admitting: Oncology

## 2019-10-10 ENCOUNTER — Other Ambulatory Visit: Payer: Self-pay

## 2019-10-10 ENCOUNTER — Encounter: Payer: Self-pay | Admitting: Oncology

## 2019-10-10 DIAGNOSIS — D649 Anemia, unspecified: Secondary | ICD-10-CM | POA: Diagnosis not present

## 2019-10-10 DIAGNOSIS — N529 Male erectile dysfunction, unspecified: Secondary | ICD-10-CM

## 2019-10-10 NOTE — Progress Notes (Signed)
Patient denies new problems/concerns today.   °

## 2019-10-11 NOTE — Progress Notes (Signed)
HEMATOLOGY-ONCOLOGY TeleHEALTH VISIT PROGRESS NOTE  I connected with Phillip Norman on 10/11/19 at  2:15 PM EDT by video enabled telemedicine visit and verified that I am speaking with the correct person using two identifiers. I discussed the limitations, risks, security and privacy concerns of performing an evaluation and management service by telemedicine and the availability of in-person appointments. I also discussed with the patient that there may be a patient responsible charge related to this service. The patient expressed understanding and agreed to proceed.   Other persons participating in the visit and their role in the encounter:  None  Patient's location: Home  Provider's location: office Chief Complaint: Follow-up for anemia   INTERVAL HISTORY Phillip Norman is a 49 y.o. male who has above history reviewed by me today presents for follow up visit for anemia Problems and complaints are listed below:  Patient had blood work done during interval.  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 Patient denies any unintentional weight loss, joint pain, skin rash.  Review of Systems  Constitutional: Negative for appetite change, chills, fatigue, fever and unexpected weight change.  HENT:   Negative for hearing loss and voice change.   Eyes: Negative for eye problems and icterus.  Respiratory: Negative for chest tightness, cough and shortness of breath.   Cardiovascular: Negative for chest pain and leg swelling.  Gastrointestinal: Negative for abdominal distention and abdominal pain.  Endocrine: Negative for hot flashes.  Genitourinary: Negative for difficulty urinating, dysuria and frequency.   Musculoskeletal: Negative for arthralgias.  Skin: Negative for itching and rash.  Neurological: Negative for light-headedness and numbness.  Hematological: Negative for adenopathy. Does not bruise/bleed easily.  Psychiatric/Behavioral: Negative for confusion.    Past Medical  History:  Diagnosis Date  . Chronic back pain   . Diabetes mellitus without complication ([place])   . Hyperlipidemia   . Hypertension    Past Surgical History:  Procedure Laterality Date  . TONSILLECTOMY AND ADENOIDECTOMY      Family History  Problem Relation Age of Onset  . Hypertension Mother   . Diabetes Father   . Breast cancer Paternal Aunt   . Diabetes Paternal Aunt   . Prostate cancer Paternal Uncle     Social History   Socioeconomic History  . Marital status: Married    Spouse name: Not on file  . Number of children: 1  . Years of education: Not on file  . Highest education level: Not on file  Occupational History  . Occupation: SENIOR FINANCE MANAGER  Tobacco Use  . Smoking status: Never Smoker  . Smokeless tobacco: Never Used  Substance and Sexual Activity  . Alcohol use: No  . Drug use: No  . Sexual activity: Not on file  Other Topics Concern  . Not on file  Social History Narrative   REGULAR EXERCISE-NO   MASTER LEVEL EDUCATION   Social Determinants of Health   Financial Resource Strain:   . Difficulty of Paying Living Expenses:   Food Insecurity:   . Worried About Charity fundraiser in the Last Year:   . Arboriculturist in the Last Year:   Transportation Needs:   . Film/video editor (Medical):   Marland Kitchen Lack of Transportation (Non-Medical):   Physical Activity:   . Days of Exercise per Week:   . Minutes of Exercise per Session:   Stress:   . Feeling of Stress :   Social Connections:   . Frequency of Communication with Friends and  Family:   . Frequency of Social Gatherings with Friends and Family:   . Attends Religious Services:   . Active Member of Clubs or Organizations:   . Attends Archivist Meetings:   Marland Kitchen Marital Status:   Intimate Partner Violence:   . Fear of Current or Ex-Partner:   . Emotionally Abused:   Marland Kitchen Physically Abused:   . Sexually Abused:     Current Outpatient Medications on File Prior to Visit  Medication Sig  Dispense Refill  . amLODipine (NORVASC) 10 MG tablet TAKE 1 TABLET BY MOUTH EVERY DAY 90 tablet 1  . fluticasone (FLONASE) 50 MCG/ACT nasal spray Place 2 sprays into both nostrils daily. 16 g 6  . losartan (COZAAR) 100 MG tablet Take 1 tablet (100 mg total) by mouth daily. 90 tablet 3  . metFORMIN (GLUCOPHAGE-XR) 500 MG 24 hr tablet TAKE 4 TABLETS BY MOUTH DAILY WITH BREAKFAST 360 tablet 0  . sildenafil (VIAGRA) 50 MG tablet TAKE 1 TABLET BY MOUTH DAILY AS NEEDED FOR ERECTILE DYSFUNCTION *INSUR MAX 24 PER 90 DAYS 14 tablet 2  . omeprazole (PRILOSEC) 20 MG capsule Take 1 capsule (20 mg total) by mouth daily. (Patient not taking: Reported on 09/25/2019) 30 capsule 3   No current facility-administered medications on file prior to visit.    No Known Allergies     Observations/Objective: Today's Vitals   10/10/19 1418  PainSc: 0-No pain   There is no height or weight on file to calculate BMI.  Physical Exam  Constitutional: No distress.  Neurological: He is alert.    CBC    Component Value Date/Time   WBC 4.8 09/26/2019 0849   RBC 4.32 09/26/2019 0849   RBC 4.37 09/26/2019 0849   HGB 11.9 (L) 09/26/2019 0849   HCT 37.4 (L) 09/26/2019 0849   PLT 266 09/26/2019 0849   MCV 85.6 09/26/2019 0849   MCH 27.2 09/26/2019 0849   MCHC 31.8 09/26/2019 0849   RDW 14.3 09/26/2019 0849   LYMPHSABS 2.1 09/26/2019 0849   MONOABS 0.4 09/26/2019 0849   EOSABS 0.1 09/26/2019 0849   BASOSABS 0.0 09/26/2019 0849    CMP     Component Value Date/Time   NA 139 09/26/2019 0849   NA 140 08/27/2015 0935   K 3.9 09/26/2019 0849   CL 102 09/26/2019 0849   CO2 28 09/26/2019 0849   GLUCOSE 211 (H) 09/26/2019 0849   GLUCOSE 115 (H) 04/19/2006 1403   BUN 10 09/26/2019 0849   BUN 10 08/27/2015 0935   CREATININE 0.90 09/26/2019 0849   CREATININE 0.88 06/04/2017 1620   CALCIUM 9.0 09/26/2019 0849   PROT 7.2 09/26/2019 0849   ALBUMIN 4.2 09/26/2019 0849   AST 15 09/26/2019 0849   ALT 14 09/26/2019  0849   ALKPHOS 64 09/26/2019 0849   BILITOT 0.6 09/26/2019 0849   GFRNONAA >60 09/26/2019 0849   GFRAA >60 09/26/2019 0849     Assessment and Plan: 1. Normocytic anemia   2. Erectile dysfunction, unspecified erectile dysfunction type     #Normocytic anemia, iron panel is not consistent with iron deficiency.  Normal vitamin B12 and folate level. Hemoglobin 11.9,  Patient has a positive ANA, homogeneous pattern, 1:80.  Clinically he does not have any signs of autoimmune disorder. ?  Drug-induced ANA positivity, i.e. omeprazole I will further discuss with primary care provider Dr. Biagio Quint to see if patient needs to see rheumatology for further evaluation. I will check multiple myeloma panel, LDH, haptoglobin,   repeat  ferritin- reticulocyte hemoglobin 29  #Rectal dysfunction patient's testosterone is around low normal end, which may contribute to his symptoms.   Follow Up Instructions: 6 months   I discussed the assessment and treatment plan with the patient. The patient was provided an opportunity to ask questions and all were answered. The patient agreed with the plan and demonstrated an understanding of the instructions.  The patient was advised to call back or seek an in-person evaluation if the symptoms worsen or if the condition fails to improve as anticipated.    Earlie Server, MD 10/11/2019 12:15 PM

## 2019-10-12 ENCOUNTER — Inpatient Hospital Stay: Payer: 59

## 2019-10-13 ENCOUNTER — Other Ambulatory Visit: Payer: Self-pay

## 2019-10-13 ENCOUNTER — Inpatient Hospital Stay: Payer: 59

## 2019-10-13 DIAGNOSIS — D649 Anemia, unspecified: Secondary | ICD-10-CM

## 2019-10-13 DIAGNOSIS — N529 Male erectile dysfunction, unspecified: Secondary | ICD-10-CM

## 2019-10-13 LAB — FERRITIN: Ferritin: 31 ng/mL (ref 24–336)

## 2019-10-13 LAB — LACTATE DEHYDROGENASE: LDH: 111 U/L (ref 98–192)

## 2019-10-14 LAB — HAPTOGLOBIN: Haptoglobin: 159 mg/dL (ref 23–355)

## 2019-10-16 LAB — KAPPA/LAMBDA LIGHT CHAINS
Kappa free light chain: 22.4 mg/L — ABNORMAL HIGH (ref 3.3–19.4)
Kappa, lambda light chain ratio: 1.6 (ref 0.26–1.65)
Lambda free light chains: 14 mg/L (ref 5.7–26.3)

## 2019-10-17 LAB — MULTIPLE MYELOMA PANEL, SERUM
Albumin SerPl Elph-Mcnc: 3.7 g/dL (ref 2.9–4.4)
Albumin/Glob SerPl: 1.4 (ref 0.7–1.7)
Alpha 1: 0.2 g/dL (ref 0.0–0.4)
Alpha2 Glob SerPl Elph-Mcnc: 0.6 g/dL (ref 0.4–1.0)
B-Globulin SerPl Elph-Mcnc: 1 g/dL (ref 0.7–1.3)
Gamma Glob SerPl Elph-Mcnc: 0.9 g/dL (ref 0.4–1.8)
Globulin, Total: 2.7 g/dL (ref 2.2–3.9)
IgA: 182 mg/dL (ref 90–386)
IgG (Immunoglobin G), Serum: 853 mg/dL (ref 603–1613)
IgM (Immunoglobulin M), Srm: 114 mg/dL (ref 20–172)
Total Protein ELP: 6.4 g/dL (ref 6.0–8.5)

## 2019-10-18 ENCOUNTER — Other Ambulatory Visit: Payer: Self-pay

## 2019-10-18 ENCOUNTER — Ambulatory Visit (INDEPENDENT_AMBULATORY_CARE_PROVIDER_SITE_OTHER): Payer: 59 | Admitting: Gastroenterology

## 2019-10-18 ENCOUNTER — Encounter: Payer: Self-pay | Admitting: Gastroenterology

## 2019-10-18 VITALS — BP 126/81 | HR 86 | Temp 98.1°F | Wt 226.4 lb

## 2019-10-18 DIAGNOSIS — Z1211 Encounter for screening for malignant neoplasm of colon: Secondary | ICD-10-CM | POA: Diagnosis not present

## 2019-10-18 DIAGNOSIS — R1012 Left upper quadrant pain: Secondary | ICD-10-CM

## 2019-10-18 DIAGNOSIS — K581 Irritable bowel syndrome with constipation: Secondary | ICD-10-CM | POA: Diagnosis not present

## 2019-10-18 MED ORDER — OMEPRAZOLE 40 MG PO CPDR: 40.0000 mg | DELAYED_RELEASE_CAPSULE | Freq: Every day | ORAL | 0 refills | Status: DC

## 2019-10-18 MED ORDER — NA SULFATE-K SULFATE-MG SULF 17.5-3.13-1.6 GM/177ML PO SOLN: 354.0000 mL | Freq: Once | ORAL | 0 refills | Status: AC

## 2019-10-18 NOTE — Progress Notes (Signed)
Jonathon Bellows MD, MRCP(U.K) [address]  [place]  [place],  [address]  Main: [phone]  Fax: [phone]   Gastroenterology Consultation  Referring Provider:     Leone Haven, MD Primary Care Physician:  Leone Haven, MD Primary Gastroenterologist:  Dr. Jonathon Bellows  Reason for Consultation:     Abdominal pain        HPI:   Phillip Norman is a 49 y.o. y/o male referred for consultation & management  by Dr. Caryl Bis, Angela Adam, MD.    He was referred for left upper quadrant pain after eating in March 2021.  He follows with Dr. Tasia Catchings in hematology for normocytic anemia.  CT abdomen and pelvis in 2018 for abdominal pain showed no acute abnormality.   Abdominal pain: Onset: A few months back Site : Left upper quadrant Radiation: Nonradiating Severity : Moderate Nature of pain: Deep pressure Aggravating factors: Right after eating food Relieving factors : Sometimes a bowel movement and rest Weight loss: No NSAID use: No PPI use : No Gall bladder surgery: No Frequency of bowel movements: 1 to 2 days Change in bowel movements: No Relief with bowel movements: At times Gas/Bloating/Abdominal distension: No  Never had a screening colonoscopy no family history of colon cancer or polyps    Past Medical History:  Diagnosis Date  . Chronic back pain   . Diabetes mellitus without complication ([place])   . Hyperlipidemia   . Hypertension     Past Surgical History:  Procedure Laterality Date  . TONSILLECTOMY AND ADENOIDECTOMY      Prior to Admission medications   Medication Sig Start Date End Date Taking? Authorizing Provider  amLODipine (NORVASC) 10 MG tablet TAKE 1 TABLET BY MOUTH EVERY DAY 03/20/19   Leone Haven, MD  fluticasone [facility]) 50 MCG/ACT nasal spray Place 2 sprays into both nostrils daily. 06/13/18   Leone Haven, MD  losartan (COZAAR) 100 MG tablet Take 1 tablet (100 mg total) by mouth daily. 10/19/18   Leone Haven, MD   metFORMIN (GLUCOPHAGE-XR) 500 MG 24 hr tablet TAKE 4 TABLETS BY MOUTH DAILY WITH BREAKFAST 09/27/19   Leone Haven, MD  omeprazole (PRILOSEC) 20 MG capsule Take 1 capsule (20 mg total) by mouth daily. Patient not taking: Reported on 09/25/2019 07/12/18   Leone Haven, MD  sildenafil (VIAGRA) 50 MG tablet TAKE 1 TABLET BY MOUTH DAILY AS NEEDED FOR ERECTILE DYSFUNCTION *INSUR MAX 24 PER 90 DAYS 06/29/19   Leone Haven, MD    Family History  Problem Relation Age of Onset  . Hypertension Mother   . Diabetes Father   . Breast cancer Paternal Aunt   . Diabetes Paternal Aunt   . Prostate cancer Paternal Uncle      Social History   Tobacco Use  . Smoking status: Never Smoker  . Smokeless tobacco: Never Used  Substance Use Topics  . Alcohol use: No  . Drug use: No    Allergies as of 10/18/2019  . (No Known Allergies)    Review of Systems:    All systems reviewed and negative except where noted in HPI.   Physical Exam:  There were no vitals taken for this visit. No LMP for male patient. Psych:  Alert and cooperative. Normal mood and affect. General:   Alert,  Well-developed, well-nourished, pleasant and cooperative in NAD Head:  Normocephalic and atraumatic. Eyes:  Sclera clear, no icterus.   Conjunctiva pink. Ears:  Normal auditory acuity. Lungs:  Respirations even and unlabored.  Clear throughout to auscultation.   No wheezes, crackles, or rhonchi. No acute distress. Heart:  Regular rate and rhythm; no murmurs, clicks, rubs, or gallops. Abdomen:  Normal bowel sounds.  No bruits.  Soft, non-tender and non-distended without masses, hepatosplenomegaly or hernias noted.  No guarding or rebound tenderness.    Extremities:  No clubbing or edema.  No cyanosis. Neurologic:  Alert and oriented x3;  grossly normal neurologically. Psych:  Alert and cooperative. Normal mood and affect.  Imaging Studies: No results found.  Assessment and Plan:   Phillip Norman is a 50 y.o.  y/o male has been referred for left upper quadrant pain.  Differentials include a gastric ulcer versus IBS-C.  Never had a screening colonoscopy and is 49 discussed recent change of guidelines by [place] PTF  Plan 1.  H. pylori breath test 2.  Trial of Prilosec 40 mg once a day 3.  High-fiber diet: Discussed aim to get about 25 to 30 g of fiber per day and provided  patient information.  If does not help will commence on MiraLAX 4.  Screening colonoscopy average risk 5.  If abdominal pain not better with these interventions will need abdominal imaging and upper endoscopy  I have discussed alternative options, risks & benefits,  which include, but are not limited to, bleeding, infection, perforation,respiratory complication & drug reaction.  The patient agrees with this plan & written consent will be obtained.     Follow up in 6-8 weeks  Dr Jonathon Bellows MD,MRCP(U.K)

## 2019-10-18 NOTE — Patient Instructions (Signed)

## 2019-10-20 LAB — H. PYLORI BREATH TEST: H pylori Breath Test: NEGATIVE

## 2019-10-24 ENCOUNTER — Encounter: Payer: Self-pay | Admitting: Gastroenterology

## 2019-11-01 ENCOUNTER — Ambulatory Visit: Payer: 59 | Admitting: Family Medicine

## 2019-11-01 DIAGNOSIS — Z0289 Encounter for other administrative examinations: Secondary | ICD-10-CM

## 2019-11-06 ENCOUNTER — Other Ambulatory Visit: Payer: Self-pay

## 2019-11-06 DIAGNOSIS — Z1211 Encounter for screening for malignant neoplasm of colon: Secondary | ICD-10-CM

## 2019-11-08 ENCOUNTER — Other Ambulatory Visit: Payer: Self-pay

## 2019-11-08 ENCOUNTER — Other Ambulatory Visit
Admission: RE | Admit: 2019-11-08 | Discharge: 2019-11-08 | Disposition: A | Discharge status: Home / Self Care | Payer: 59 | Source: Ambulatory Visit | Attending: Gastroenterology | Admitting: Gastroenterology

## 2019-11-08 DIAGNOSIS — Z20822 Contact with and (suspected) exposure to covid-19: Secondary | ICD-10-CM | POA: Insufficient documentation

## 2019-11-08 DIAGNOSIS — Z01812 Encounter for preprocedural laboratory examination: Secondary | ICD-10-CM | POA: Diagnosis not present

## 2019-11-09 LAB — SARS CORONAVIRUS 2 (TAT 6-24 HRS): SARS Coronavirus 2: NEGATIVE

## 2019-11-10 ENCOUNTER — Ambulatory Visit
Admission: RE | Admit: 2019-11-10 | Discharge: 2019-11-10 | Disposition: A | Discharge status: Home / Self Care | Payer: 59 | Attending: Gastroenterology | Admitting: Gastroenterology

## 2019-11-10 ENCOUNTER — Other Ambulatory Visit: Payer: Self-pay

## 2019-11-10 ENCOUNTER — Ambulatory Visit: Payer: 59 | Admitting: Certified Registered Nurse Anesthetist

## 2019-11-10 ENCOUNTER — Encounter
Admission: RE | Disposition: A | Discharge status: Home / Self Care | Payer: Self-pay | Source: Home / Self Care | Attending: Gastroenterology

## 2019-11-10 ENCOUNTER — Encounter: Payer: Self-pay | Admitting: Gastroenterology

## 2019-11-10 DIAGNOSIS — K219 Gastro-esophageal reflux disease without esophagitis: Secondary | ICD-10-CM | POA: Diagnosis not present

## 2019-11-10 DIAGNOSIS — Z79899 Other long term (current) drug therapy: Secondary | ICD-10-CM | POA: Diagnosis not present

## 2019-11-10 DIAGNOSIS — Z1211 Encounter for screening for malignant neoplasm of colon: Secondary | ICD-10-CM

## 2019-11-10 DIAGNOSIS — I10 Essential (primary) hypertension: Secondary | ICD-10-CM | POA: Diagnosis not present

## 2019-11-10 DIAGNOSIS — Z7984 Long term (current) use of oral hypoglycemic drugs: Secondary | ICD-10-CM | POA: Diagnosis not present

## 2019-11-10 DIAGNOSIS — D122 Benign neoplasm of ascending colon: Secondary | ICD-10-CM | POA: Insufficient documentation

## 2019-11-10 DIAGNOSIS — E119 Type 2 diabetes mellitus without complications: Secondary | ICD-10-CM | POA: Diagnosis not present

## 2019-11-10 HISTORY — PX: COLONOSCOPY WITH PROPOFOL: SHX5780

## 2019-11-10 HISTORY — DX: Gastro-esophageal reflux disease without esophagitis: K21.9

## 2019-11-10 LAB — GLUCOSE, CAPILLARY: Glucose-Capillary: 175 mg/dL — ABNORMAL HIGH (ref 70–99)

## 2019-11-10 SURGERY — COLONOSCOPY WITH PROPOFOL
Anesthesia: General

## 2019-11-10 MED ORDER — LIDOCAINE HCL (CARDIAC) PF 100 MG/5ML IV SOSY
PREFILLED_SYRINGE | INTRAVENOUS | Status: DC | PRN
  Administered 2019-11-10: 100 mg via INTRAVENOUS

## 2019-11-10 MED ORDER — LIDOCAINE HCL (PF) 2 % IJ SOLN
INTRAMUSCULAR | Status: AC
  Filled 2019-11-10: qty 15

## 2019-11-10 MED ORDER — PROPOFOL 10 MG/ML IV BOLUS
INTRAVENOUS | Status: DC | PRN
  Administered 2019-11-10: 20 mg via INTRAVENOUS
  Administered 2019-11-10: 100 mg via INTRAVENOUS

## 2019-11-10 MED ORDER — SODIUM CHLORIDE 0.9 % IV SOLN: INTRAVENOUS | Status: DC

## 2019-11-10 MED ORDER — PROPOFOL 500 MG/50ML IV EMUL
INTRAVENOUS | Status: DC | PRN
  Administered 2019-11-10: 130 ug/kg/min via INTRAVENOUS

## 2019-11-10 MED ORDER — PROPOFOL 10 MG/ML IV BOLUS
INTRAVENOUS | Status: AC
  Filled 2019-11-10: qty 120

## 2019-11-10 NOTE — Progress Notes (Signed)
   11/10/19 0750  Clinical Encounter Type  Visited With Family  Visit Type Initial  Referral From Chaplain  Consult/Referral To Chaplain  Chaplain rounded SDS waiting area and checked on family members and told the lady with Mr. Fackler if she anything to have her paged.

## 2019-11-10 NOTE — Op Note (Signed)
[facility] Gastroenterology Patient Name: Phillip Norman Procedure Date: 11/10/2019 9:20 AM MRN: [id number] Account #: [id number] Date of Birth: 1971-02-05 Admit Type: Outpatient Age: 49 Room: [facility] ENDO ROOM 3 Gender: Male Note Status: Finalized Procedure:             Colonoscopy Indications:           Screening for colorectal malignant neoplasm Providers:             Jonathon Bellows MD, MD Referring MD:          Angela Adam. Caryl Bis (Referring MD) Medicines:             Monitored Anesthesia Care Complications:         No immediate complications. Procedure:             Pre-Anesthesia Assessment:                        - Prior to the procedure, a History and Physical was                         performed, and patient medications, allergies and                         sensitivities were reviewed. The patient's tolerance                         of previous anesthesia was reviewed.                        - The risks and benefits of the procedure and the                         sedation options and risks were discussed with the                         patient. All questions were answered and informed                         consent was obtained.                        - ASA Grade Assessment: II - A patient with mild                         systemic disease.                        After obtaining informed consent, the colonoscope was                         passed under direct vision. Throughout the procedure,                         the patient's blood pressure, pulse, and oxygen                         saturations were monitored continuously. The                         Colonoscope was introduced through the anus and  advanced to the the cecum, identified by the                         appendiceal orifice. The colonoscopy was performed                         with ease. The patient tolerated the procedure well.                         The quality of  the bowel preparation was excellent. Findings:      The perianal and digital rectal examinations were normal.      A 3 mm polyp was found in the ascending colon. The polyp was sessile.       The polyp was removed with a jumbo cold forceps. Resection and retrieval       were complete.      The exam was otherwise without abnormality on direct and retroflexion       views. Impression:            - One 3 mm polyp in the ascending colon, removed with                         a jumbo cold forceps. Resected and retrieved.                        - The examination was otherwise normal on direct and                         retroflexion views. Recommendation:        - Discharge patient to home (with escort).                        - Resume previous diet.                        - Continue present medications.                        - Await pathology results.                        - Repeat colonoscopy for surveillance based on                         pathology results. Procedure Code(s):     --- Professional ---                        [phone], Colonoscopy, flexible; with biopsy, single or                         multiple Diagnosis Code(s):     --- Professional ---                        K63.5, Polyp of colon                        Z12.11, Encounter for screening for malignant neoplasm  of colon CPT copyright 2019 American Medical Association. All rights reserved. The codes documented in this report are preliminary and upon coder review may  be revised to meet current compliance requirements. Jonathon Bellows, MD Jonathon Bellows MD, MD 11/10/2019 9:47:24 AM This report has been signed electronically. Number of Addenda: 0 Note Initiated On: 11/10/2019 9:20 AM Scope Withdrawal Time: 0 hours 14 minutes 43 seconds  Total Procedure Duration: 0 hours 16 minutes 39 seconds  Estimated Blood Loss:  Estimated blood loss: none.      [name]

## 2019-11-10 NOTE — Progress Notes (Signed)
   11/10/19 0750  Clinical Encounter Type  Visited With Family  Visit Type Initial  Referral From Chaplain  Consult/Referral To Chaplain  Chaplain rounded SDS waiting area and checked on family members and told the lady with Phillip Norman if she anything to have her paged.

## 2019-11-10 NOTE — Transfer of Care (Signed)
Immediate Anesthesia Transfer of Care Note  Patient: Phillip Norman  Procedure(s) Performed: COLONOSCOPY WITH PROPOFOL (N/A )  Patient Location: PACU and Endoscopy Unit  Anesthesia Type:General  Level of Consciousness: drowsy and patient cooperative  Airway & Oxygen Therapy: Patient Spontanous Breathing  Post-op Assessment: Report given to RN and Post -op Vital signs reviewed and stable  Post vital signs: Reviewed and stable  Last Vitals:  Vitals Value Taken Time  BP 103/80 11/10/19 0950  Temp    Pulse 72 11/10/19 0950  Resp 14 11/10/19 0950  SpO2 100 % 11/10/19 0950    Last Pain:  Vitals:   11/10/19 0839  TempSrc: Temporal  PainSc: 0-No pain         Complications: No complications documented.

## 2019-11-10 NOTE — Anesthesia Preprocedure Evaluation (Signed)
Anesthesia Evaluation  Patient identified by MRN, date of birth, ID band Patient awake    Reviewed: Allergy & Precautions, NPO status , Patient's Chart, lab work & pertinent test results  History of Anesthesia Complications Negative for: history of anesthetic complications  Airway Mallampati: II  TM Distance: >3 FB Neck ROM: Full    Dental no notable dental hx.    Pulmonary neg pulmonary ROS, neg sleep apnea, neg COPD,    breath sounds clear to auscultation- rhonchi (-) wheezing      Cardiovascular Exercise Tolerance: Good hypertension, Pt. on medications (-) CAD, (-) Past MI, (-) Cardiac Stents and (-) CABG  Rhythm:Regular Rate:Normal - Systolic murmurs and - Diastolic murmurs    Neuro/Psych neg Seizures Anxiety negative neurological ROS     GI/Hepatic Neg liver ROS, GERD  ,  Endo/Other  diabetes, Oral Hypoglycemic Agents  Renal/GU negative Renal ROS     Musculoskeletal negative musculoskeletal ROS (+)   Abdominal (+) - obese,   Peds  Hematology negative hematology ROS (+)   Anesthesia Other Findings Past Medical History: No date: Chronic back pain No date: Diabetes mellitus without complication ([facility]) No date: GERD (gastroesophageal reflux disease) No date: Hyperlipidemia No date: Hypertension   Reproductive/Obstetrics                             Anesthesia Physical Anesthesia Plan  ASA: II  Anesthesia Plan: General   Post-op Pain Management:    Induction: Intravenous  PONV Risk Score and Plan: 1 and Propofol infusion  Airway Management Planned: Natural Airway  Additional Equipment:   Intra-op Plan:   Post-operative Plan:   Informed Consent: I have reviewed the patients History and Physical, chart, labs and discussed the procedure including the risks, benefits and alternatives for the proposed anesthesia with the patient or authorized representative who has indicated  his/her understanding and acceptance.     Dental advisory given  Plan Discussed with: CRNA and Anesthesiologist  Anesthesia Plan Comments:         Anesthesia Quick Evaluation

## 2019-11-10 NOTE — H&P (Signed)
Jonathon Bellows, MD [address], [place], [place], [place], [address] [address], [place], [place], [place], [address] Phone: [phone]  Fax: [phone]  Primary Care Physician:  Leone Haven, MD   Pre-Procedure History & Physical: HPI:  Phillip Norman is a 49 y.o. male is here for an colonoscopy.   Past Medical History:  Diagnosis Date  . Chronic back pain   . Diabetes mellitus without complication ([place])   . GERD (gastroesophageal reflux disease)   . Hyperlipidemia   . Hypertension     Past Surgical History:  Procedure Laterality Date  . TONSILLECTOMY AND ADENOIDECTOMY      Prior to Admission medications   Medication Sig Start Date End Date Taking? Authorizing Provider  amLODipine (NORVASC) 10 MG tablet TAKE 1 TABLET BY MOUTH EVERY DAY 03/20/19  Yes Leone Haven, MD  fluticasone [facility]) 50 MCG/ACT nasal spray Place 2 sprays into both nostrils daily. 06/13/18  Yes Leone Haven, MD  losartan (COZAAR) 100 MG tablet Take 1 tablet (100 mg total) by mouth daily. 10/19/18  Yes Leone Haven, MD  metFORMIN (GLUCOPHAGE-XR) 500 MG 24 hr tablet TAKE 4 TABLETS BY MOUTH DAILY WITH BREAKFAST 09/27/19  Yes Leone Haven, MD  omeprazole (PRILOSEC) 40 MG capsule Take 1 capsule (40 mg total) by mouth daily before breakfast. 10/18/19  Yes Jonathon Bellows, MD  sildenafil (VIAGRA) 50 MG tablet TAKE 1 TABLET BY MOUTH DAILY AS NEEDED FOR ERECTILE DYSFUNCTION *INSUR MAX 24 PER 90 DAYS 06/29/19  Yes Leone Haven, MD    Allergies as of 10/18/2019  . (No Known Allergies)    Family History  Problem Relation Age of Onset  . Hypertension Mother   . Diabetes Father   . Breast cancer Paternal Aunt   . Diabetes Paternal Aunt   . Prostate cancer Paternal Uncle     Social History   Socioeconomic History  . Marital status: Married    Spouse name: Not on file  . Number of children: 1  . Years of education: Not on file  . Highest education level: Not on file    Occupational History  . Occupation: SENIOR FINANCE MANAGER  Tobacco Use  . Smoking status: Never Smoker  . Smokeless tobacco: Never Used  Vaping Use  . Vaping Use: Never used  Substance and Sexual Activity  . Alcohol use: No  . Drug use: No  . Sexual activity: Not on file  Other Topics Concern  . Not on file  Social History Narrative   REGULAR EXERCISE-NO   MASTER LEVEL EDUCATION   Social Determinants of Health   Financial Resource Strain:   . Difficulty of Paying Living Expenses:   Food Insecurity:   . Worried About Charity fundraiser in the Last Year:   . Arboriculturist in the Last Year:   Transportation Needs:   . Film/video editor (Medical):   Marland Kitchen Lack of Transportation (Non-Medical):   Physical Activity:   . Days of Exercise per Week:   . Minutes of Exercise per Session:   Stress:   . Feeling of Stress :   Social Connections:   . Frequency of Communication with Friends and Family:   . Frequency of Social Gatherings with Friends and Family:   . Attends Religious Services:   . Active Member of Clubs or Organizations:   . Attends Archivist Meetings:   Marland Kitchen Marital Status:   Intimate Partner Violence:   .  Fear of Current or Ex-Partner:   . Emotionally Abused:   Marland Kitchen Physically Abused:   . Sexually Abused:     Review of Systems: See HPI, otherwise negative ROS  Physical Exam: BP (!) 134/93   Pulse 70   Temp (!) 96.7 F (35.9 C) (Temporal)   Resp 16   Ht 6\' 2"  (1.88 m)   Wt 99.8 kg   SpO2 100%   BMI 28.25 kg/m  General:   Alert,  pleasant and cooperative in NAD Head:  Normocephalic and atraumatic. Neck:  Supple; no masses or thyromegaly. Lungs:  Clear throughout to auscultation, normal respiratory effort.    Heart:  +S1, +S2, Regular rate and rhythm, No edema. Abdomen:  Soft, nontender and nondistended. Normal bowel sounds, without guarding, and without rebound.   Neurologic:  Alert and  oriented x4;  grossly normal  neurologically.  Impression/Plan: Phillip Norman is here for an colonoscopy to be performed for Screening colonoscopy average risk   Risks, benefits, limitations, and alternatives regarding  colonoscopy have been reviewed with the patient.  Questions have been answered.  All parties agreeable.   Jonathon Bellows, MD  11/10/2019, 9:17 AM

## 2019-11-10 NOTE — Anesthesia Postprocedure Evaluation (Signed)
Anesthesia Post Note  Patient: Phillip Norman  Procedure(s) Performed: COLONOSCOPY WITH PROPOFOL (N/A )  Patient location during evaluation: Endoscopy Anesthesia Type: General Level of consciousness: awake and alert and oriented Pain management: pain level controlled Vital Signs Assessment: post-procedure vital signs reviewed and stable Respiratory status: spontaneous breathing, nonlabored ventilation and respiratory function stable Cardiovascular status: blood pressure returned to baseline and stable Postop Assessment: no signs of nausea or vomiting Anesthetic complications: no   No complications documented.   Last Vitals:  Vitals:   11/10/19 1000 11/10/19 1010  BP: 121/89 (!) 126/93  Pulse: 73 71  Resp: 12 20  Temp:    SpO2: 100% 100%    Last Pain:  Vitals:   11/10/19 0839  TempSrc: Temporal  PainSc: 0-No pain                 Angellynn Kimberlin

## 2019-11-13 ENCOUNTER — Encounter: Payer: Self-pay | Admitting: Gastroenterology

## 2019-11-13 LAB — SURGICAL PATHOLOGY

## 2019-11-30 ENCOUNTER — Other Ambulatory Visit: Payer: Self-pay | Admitting: Family Medicine

## 2019-12-12 ENCOUNTER — Ambulatory Visit: Payer: 59 | Admitting: Gastroenterology

## 2020-01-02 ENCOUNTER — Other Ambulatory Visit: Payer: Self-pay | Admitting: Family Medicine

## 2020-01-06 ENCOUNTER — Other Ambulatory Visit: Payer: Self-pay | Admitting: Family Medicine

## 2020-03-19 ENCOUNTER — Telehealth: Payer: Self-pay | Admitting: Family Medicine

## 2020-03-19 DIAGNOSIS — R079 Chest pain, unspecified: Secondary | ICD-10-CM

## 2020-03-19 NOTE — Telephone Encounter (Signed)
Noted. Is he having any issues currently? Any chest pain? Shortness of breath? Other symptoms?

## 2020-03-19 NOTE — Telephone Encounter (Signed)
LVM for the patient to call back and ask for Gae Bon.  Harjot Zavadil,cma

## 2020-03-19 NOTE — Telephone Encounter (Signed)
Pt would like a referral placed back to cardiology  Since the one from March has been closed.  Luvinia Lucy,cma

## 2020-03-19 NOTE — Telephone Encounter (Signed)
Pt would like a referral placed back to cardiology  Since the one from March has been closed

## 2020-03-19 NOTE — Telephone Encounter (Signed)
I spoke with the patient to ask if he is having any chest pain, SOB or symptoms pertaining to why he needed a referral to Cardiology.  Patient stated he is getting new life Insurance and they saw that he had seen the provider for chest pain as a visit and they are suggesting him to see Cardiology to rule out any issues, but the patient is not having any difficulties at all.  Phillip Norman,cma

## 2020-03-20 NOTE — Addendum Note (Signed)
Addended by: Leone Haven on: 03/20/2020 09:04 AM   Modules accepted: Orders

## 2020-03-20 NOTE — Telephone Encounter (Signed)
I called and informed the patient that the referral to Cardiology was put in and they will call to schedule.  Phillip Norman,cma

## 2020-03-20 NOTE — Telephone Encounter (Signed)
Referral placed.

## 2020-03-26 ENCOUNTER — Telehealth: Payer: Self-pay | Admitting: Family Medicine

## 2020-03-26 MED ORDER — AMLODIPINE BESYLATE 10 MG PO TABS: 10.0000 mg | ORAL_TABLET | Freq: Every day | ORAL | 1 refills | Status: DC

## 2020-03-26 MED ORDER — LOSARTAN POTASSIUM 100 MG PO TABS: 100.0000 mg | ORAL_TABLET | Freq: Every day | ORAL | 3 refills | Status: DC

## 2020-03-26 NOTE — Telephone Encounter (Signed)
Medication has been sent in 

## 2020-03-26 NOTE — Telephone Encounter (Signed)
Two weeks ago patient throw out BP medication by accident. Patient has not had his medication. BP is running 165/105. Patient needs medication asap.

## 2020-03-28 ENCOUNTER — Other Ambulatory Visit: Payer: Self-pay

## 2020-03-28 ENCOUNTER — Ambulatory Visit (INDEPENDENT_AMBULATORY_CARE_PROVIDER_SITE_OTHER): Payer: 59 | Admitting: Cardiology

## 2020-03-28 ENCOUNTER — Encounter: Payer: Self-pay | Admitting: Cardiology

## 2020-03-28 DIAGNOSIS — I10 Essential (primary) hypertension: Secondary | ICD-10-CM

## 2020-03-28 DIAGNOSIS — R079 Chest pain, unspecified: Secondary | ICD-10-CM

## 2020-03-28 DIAGNOSIS — K219 Gastro-esophageal reflux disease without esophagitis: Secondary | ICD-10-CM

## 2020-03-28 NOTE — Progress Notes (Signed)
Cardiology Office Note:    Date:  03/28/2020   ID:  Phillip Norman, DOB 1971/05/17, MRN [id number]  PCP:  Leone Haven, MD  [facility] Cardiologist:  Kate Sable, MD  [place] Electrophysiologist:  None   Referring MD: Leone Haven, MD   Chief Complaint  Patient presents with  . New Patient (Initial Visit)    Establish care with provider for previous chest pains, chest pains has since resolved. Medications verbally reviewed with patient.    Marrion Norman is a 49 y.o. male who is being seen today for the evaluation of chest pain at the request of Caryl Bis Angela Adam, MD.   History of Present Illness:    Phillip Norman is a 49 y.o. male with a hx of hypertension, diabetes, GERD who presents due to chest pain.  Patient states having chest pain symptoms about 8 months ago in March 2021.  At the time symptoms were deemed secondary to reflux.  He saw gastroenterology, was given Prilosec which helped symptoms.  He has not had any recurrent chest pain since then.  He is planning to obtain life insurance, since the prior note in the chart recommended cardiac evaluation at the time, insurance company wanted patient to get evaluated by cardiology.  He denies any history of heart disease, takes all his blood pressure medications as prescribed.  Denies chest pain at rest or with exertion.  Also denies shortness of breath at rest or with exertion.  Denies syncope, palpitations, dizziness.  Echo 07/2015 showed normal systolic and diastolic function, EF 60 to 65%.  Past Medical History:  Diagnosis Date  . Chronic back pain   . Diabetes mellitus without complication ([place])   . GERD (gastroesophageal reflux disease)   . Hyperlipidemia   . Hypertension     Past Surgical History:  Procedure Laterality Date  . COLONOSCOPY WITH PROPOFOL N/A 11/10/2019   Procedure: COLONOSCOPY WITH PROPOFOL;  Surgeon: Jonathon Bellows, MD;  Location[facility] ENDOSCOPY;  Service: Gastroenterology;   Laterality: N/A;  . TONSILLECTOMY AND ADENOIDECTOMY      Current Medications: Current Meds  Medication Sig  . amLODipine (NORVASC) 10 MG tablet Take 1 tablet (10 mg total) by mouth daily.  . fluticasone (FLONASE) 50 MCG/ACT nasal spray Place 2 sprays into both nostrils daily.  Marland Kitchen losartan (COZAAR) 100 MG tablet Take 1 tablet (100 mg total) by mouth daily.  . metFORMIN (GLUCOPHAGE-XR) 500 MG 24 hr tablet TAKE 4 TABLETS BY MOUTH DAILY WITH BREAKFAST  . sildenafil (VIAGRA) 50 MG tablet TAKE 1 TABLET BY MOUTH DAILY AS NEEDED FOR ERECTILE DYSFUNCTION *INSUR MAX 24 PER 90 DAYS     Allergies:   Patient has no known allergies.   Social History   Socioeconomic History  . Marital status: Married    Spouse name: Not on file  . Number of children: 1  . Years of education: Not on file  . Highest education level: Not on file  Occupational History  . Occupation: SENIOR FINANCE MANAGER  Tobacco Use  . Smoking status: Never Smoker  . Smokeless tobacco: Never Used  Vaping Use  . Vaping Use: Never used  Substance and Sexual Activity  . Alcohol use: No  . Drug use: No  . Sexual activity: Not on file  Other Topics Concern  . Not on file  Social History Narrative   REGULAR EXERCISE-NO   MASTER LEVEL EDUCATION   Social Determinants of Health   Financial Resource Strain:   . Difficulty of Paying Living  Expenses: Not on file  Food Insecurity:   . Worried About Charity fundraiser in the Last Year: Not on file  . Ran Out of Food in the Last Year: Not on file  Transportation Needs:   . Lack of Transportation (Medical): Not on file  . Lack of Transportation (Non-Medical): Not on file  Physical Activity:   . Days of Exercise per Week: Not on file  . Minutes of Exercise per Session: Not on file  Stress:   . Feeling of Stress : Not on file  Social Connections:   . Frequency of Communication with Friends and Family: Not on file  . Frequency of Social Gatherings with Friends and Family: Not  on file  . Attends Religious Services: Not on file  . Active Member of Clubs or Organizations: Not on file  . Attends Archivist Meetings: Not on file  . Marital Status: Not on file     Family History: The patient's family history includes Breast cancer in his paternal aunt; Diabetes in his father and paternal aunt; Hypertension in his mother; Prostate cancer in his paternal uncle.  ROS:   Please see the history of present illness.     All other systems reviewed and are negative.  EKGs/Labs/Other Studies Reviewed:    The following studies were reviewed today:   EKG:  EKG is  ordered today.  The ekg ordered today demonstrates mild sinus rhythm  Recent Labs: 07/27/2019: TSH 2.663 09/26/2019: ALT 14; BUN 10; Creatinine, Ser 0.90; Hemoglobin 11.9; Platelets 266; Potassium 3.9; Sodium 139  Recent Lipid Panel    Component Value Date/Time   CHOL 177 12/21/2018 0932   TRIG 102.0 12/21/2018 0932   TRIG 147 04/19/2006 1403   HDL 51.40 12/21/2018 0932   CHOLHDL 3 12/21/2018 0932   VLDL 20.4 12/21/2018 0932   LDLCALC 105 (H) 12/21/2018 0932   LDLDIRECT 124.0 (H) 07/27/2019 0952     Risk Assessment/Calculations:      Physical Exam:    VS:  BP (!) 138/100 (BP Location: Right Arm, Patient Position: Sitting, Cuff Size: Normal)   Pulse 84   Ht 6\' 2"  (1.88 m)   Wt 219 lb (99.3 kg)   SpO2 98%   BMI 28.12 kg/m     Wt Readings from Last 3 Encounters:  03/28/20 219 lb (99.3 kg)  11/10/19 220 lb (99.8 kg)  10/18/19 226 lb 6 oz (102.7 kg)     GEN:  Well nourished, well developed in no acute distress HEENT: Normal NECK: No JVD; No carotid bruits LYMPHATICS: No lymphadenopathy CARDIAC: RRR, no murmurs, rubs, gallops RESPIRATORY:  Clear to auscultation without rales, wheezing or rhonchi  ABDOMEN: Soft, non-tender, non-distended MUSCULOSKELETAL:  No edema; No deformity  SKIN: Warm and dry NEUROLOGIC:  Alert and oriented x 3 PSYCHIATRIC:  Normal affect   ASSESSMENT:     1. Chest pain of uncertain etiology   2. Primary hypertension   3. Gastroesophageal reflux disease without esophagitis    PLAN:    In order of problems listed above:  1. Patient with symptoms of chest pain due to reflux over 8 months ago.  He has had no recurrent symptoms since starting Prilosec.  Denies chest pain or shortness of breath at rest or with exertion.  Previous symptoms were noncardiac in origin.  Continue Prilosec as prescribed.  No indication for cardiac testing at this time.  Previous echocardiogram in 2017 showed normal systolic and diastolic function. 2. History of hypertension, continue current  BP meds. 3. History of GERD, continue Prilosec.  Follow-up as needed   Medication Adjustments/Labs and Tests Ordered: Current medicines are reviewed at length with the patient today.  Concerns regarding medicines are outlined above.  Orders Placed This Encounter  Procedures  . EKG 12-Lead   No orders of the defined types were placed in this encounter.   Patient Instructions  Medication Instructions:  None Ordered *If you need a refill on your cardiac medications before your next appointment, please call your pharmacy*   Lab Work: None Ordered If you have labs (blood work) drawn today and your tests are completely normal, you will receive your results only by: Marland Kitchen MyChart Message (if you have MyChart) OR . A paper copy in the mail If you have any lab test that is abnormal or we need to change your treatment, we will call you to review the results.   Testing/Procedures: None Ordered   Follow-Up: At [facility], you and your health needs are our priority.  As part of our continuing mission to provide you with exceptional heart care, we have created designated Provider Care Teams.  These Care Teams include your primary Cardiologist (physician) and Advanced Practice Providers (APPs -  Physician Assistants and Nurse Practitioners) who all work together to provide you  with the care you need, when you need it.  We recommend signing up for the patient portal called "MyChart".  Sign up information is provided on this After Visit Summary.  MyChart is used to connect with patients for Virtual Visits (Telemedicine).  Patients are able to view lab/test results, encounter notes, upcoming appointments, etc.  Non-urgent messages can be sent to your provider as well.   To learn more about what you can do with MyChart, go to [email or web address].    Your next appointment:   Follow up as needed   The format for your next appointment:   In Person  Provider:   Kate Sable, MD   Other Instructions      Signed, Kate Sable, MD  03/28/2020 11:46 AM    [place]

## 2020-03-28 NOTE — Patient Instructions (Signed)
Medication Instructions:  None Ordered *If you need a refill on your cardiac medications before your next appointment, please call your pharmacy*   Lab Work: None Ordered If you have labs (blood work) drawn today and your tests are completely normal, you will receive your results only by: Marland Kitchen MyChart Message (if you have MyChart) OR . A paper copy in the mail If you have any lab test that is abnormal or we need to change your treatment, we will call you to review the results.   Testing/Procedures: None Ordered   Follow-Up: At [facility], you and your health needs are our priority.  As part of our continuing mission to provide you with exceptional heart care, we have created designated Provider Care Teams.  These Care Teams include your primary Cardiologist (physician) and Advanced Practice Providers (APPs -  Physician Assistants and Nurse Practitioners) who all work together to provide you with the care you need, when you need it.  We recommend signing up for the patient portal called "MyChart".  Sign up information is provided on this After Visit Summary.  MyChart is used to connect with patients for Virtual Visits (Telemedicine).  Patients are able to view lab/test results, encounter notes, upcoming appointments, etc.  Non-urgent messages can be sent to your provider as well.   To learn more about what you can do with MyChart, go to [email or web address].    Your next appointment:   Follow up as needed   The format for your next appointment:   In Person  Provider:   Kate Sable, MD   Other Instructions

## 2020-04-04 ENCOUNTER — Ambulatory Visit: Payer: 59 | Admitting: Cardiology

## 2020-04-07 ENCOUNTER — Other Ambulatory Visit: Payer: Self-pay | Admitting: Family Medicine

## 2020-04-09 ENCOUNTER — Telehealth: Payer: Self-pay | Admitting: Cardiology

## 2020-04-09 NOTE — Telephone Encounter (Signed)
Medical record request received over fax from [facility] processing center  Sent to medical records dept

## 2020-04-12 ENCOUNTER — Ambulatory Visit: Payer: 59 | Admitting: Oncology

## 2020-04-12 ENCOUNTER — Other Ambulatory Visit: Payer: 59

## 2020-07-31 ENCOUNTER — Other Ambulatory Visit: Payer: Self-pay | Admitting: Family Medicine

## 2020-07-31 NOTE — Telephone Encounter (Signed)
Last OV 07/26/19 advise to refill?

## 2020-08-01 NOTE — Telephone Encounter (Signed)
This patient needs follow-up scheduled.  Please get him scheduled for follow-up and then I will determine an alternative for him to take to cover him until he sees me in person.

## 2020-08-02 NOTE — Telephone Encounter (Signed)
Left message for patient to call office.  

## 2020-08-05 NOTE — Telephone Encounter (Signed)
Patient called office , appointment was made for 08/16/20 @ 11:30/that was the only time available on providers schedule. Patient will be out of his medication by that time.

## 2020-08-07 ENCOUNTER — Other Ambulatory Visit: Payer: Self-pay | Admitting: Family Medicine

## 2020-08-08 NOTE — Telephone Encounter (Signed)
Please call the pharmacy and see if the losartan is still on back order.

## 2020-08-13 ENCOUNTER — Other Ambulatory Visit: Payer: Self-pay

## 2020-08-16 ENCOUNTER — Encounter: Payer: Self-pay | Admitting: Family Medicine

## 2020-08-16 ENCOUNTER — Other Ambulatory Visit: Payer: Self-pay

## 2020-08-16 ENCOUNTER — Ambulatory Visit (INDEPENDENT_AMBULATORY_CARE_PROVIDER_SITE_OTHER): Payer: 59 | Admitting: Family Medicine

## 2020-08-16 VITALS — BP 140/80 | HR 91 | Temp 98.2°F | Ht 74.0 in | Wt 224.4 lb

## 2020-08-16 DIAGNOSIS — E119 Type 2 diabetes mellitus without complications: Secondary | ICD-10-CM | POA: Diagnosis not present

## 2020-08-16 DIAGNOSIS — S060X0A Concussion without loss of consciousness, initial encounter: Secondary | ICD-10-CM

## 2020-08-16 DIAGNOSIS — Z8042 Family history of malignant neoplasm of prostate: Secondary | ICD-10-CM | POA: Diagnosis not present

## 2020-08-16 DIAGNOSIS — E785 Hyperlipidemia, unspecified: Secondary | ICD-10-CM

## 2020-08-16 DIAGNOSIS — S060XAA Concussion with loss of consciousness status unknown, initial encounter: Secondary | ICD-10-CM | POA: Insufficient documentation

## 2020-08-16 DIAGNOSIS — S060X9A Concussion with loss of consciousness of unspecified duration, initial encounter: Secondary | ICD-10-CM | POA: Insufficient documentation

## 2020-08-16 DIAGNOSIS — I10 Essential (primary) hypertension: Secondary | ICD-10-CM

## 2020-08-16 LAB — LIPID PANEL
Cholesterol: 200 mg/dL (ref 0–200)
HDL: 46.9 mg/dL (ref >39.00)
LDL Cholesterol: 123 mg/dL — ABNORMAL HIGH (ref 0–99)
NonHDL: 153.17
Total CHOL/HDL Ratio: 4
Triglycerides: 153 mg/dL — ABNORMAL HIGH (ref 0.0–149.0)
VLDL: 30.6 mg/dL (ref 0.0–40.0)

## 2020-08-16 LAB — COMPREHENSIVE METABOLIC PANEL
ALT: 12 U/L (ref 0–53)
AST: 10 U/L (ref 0–37)
Albumin: 4.4 g/dL (ref 3.5–5.2)
Alkaline Phosphatase: 73 U/L (ref 39–117)
BUN: 12 mg/dL (ref 6–23)
CO2: 28 mEq/L (ref 19–32)
Calcium: 9.9 mg/dL (ref 8.4–10.5)
Chloride: 101 mEq/L (ref 96–112)
Creatinine, Ser: 0.95 mg/dL (ref 0.40–1.50)
GFR: 93.54 mL/min (ref >60.00)
Glucose, Bld: 267 mg/dL — ABNORMAL HIGH (ref 70–99)
Potassium: 4 mEq/L (ref 3.5–5.1)
Sodium: 140 mEq/L (ref 135–145)
Total Bilirubin: 0.5 mg/dL (ref 0.2–1.2)
Total Protein: 6.9 g/dL (ref 6.0–8.3)

## 2020-08-16 LAB — PSA: PSA: 1.14 ng/mL (ref 0.10–4.00)

## 2020-08-16 LAB — HEMOGLOBIN A1C: Hgb A1c MFr Bld: 11.8 % — ABNORMAL HIGH (ref 4.6–6.5)

## 2020-08-16 NOTE — Assessment & Plan Note (Signed)
Adequately controlled at home.  He will continue his amlodipine.  I did discuss the option of switching to HCTZ given his swelling though he has declined this.  He will try to prop his feet up as the swelling may be related to some measure of venous insufficiency.  Lab work today.

## 2020-08-16 NOTE — Patient Instructions (Signed)
Nice to see you. You need mental and physical rest.  Please let us know on Monday if you continue to have symptoms. If you develop any worsening headache or have any neurological symptoms such as numbness or weakness or sudden vision changes please seek medical attention immediately. We will contact you with your lab results.

## 2020-08-16 NOTE — Assessment & Plan Note (Signed)
A1c today.  He will continue Metformin XR 2000 mg once daily.

## 2020-08-16 NOTE — Assessment & Plan Note (Signed)
Patient with an apparent mild concussion.  Discussed that treatment includes mental and physical rest and I have advised this at least through the weekend.  If his symptoms persist on Monday he will contact us and we can take him out of work for further duration.  If his symptoms are persistent at that time I would refer him to our concussion clinic with our sports medicine colleagues.  Advised to seek medical attention for worsening headache or neurological symptoms.  At this time I do not believe he warrants imaging and the patient was in agreement with this.

## 2020-08-16 NOTE — Progress Notes (Signed)
Phillip Rumps, MD Phone: [phone]  Phillip Norman is a 50 y.o. male who presents today for f/u.  DIABETES Disease Monitoring: Blood Sugar ranges-not checking Polyuria/phagia/dipsia- no      Optho- reports he saw them in January Medications: Compliance- taking metformin Hypoglycemic symptoms- no  HYPERTENSION  Disease Monitoring  Home BP Monitoring 120s/78-80 Chest pain- no    Dyspnea- no Medications  Compliance-  Taking amlodipine.   Edema- in his feet, resolves over night or with propping up his feet  Head injury: Patient notes 5 days ago he was packing up a truck to go to the airport and he ended up hitting his head on the tailgate.  He noted a little bit of swelling on the left anterior superior portion of his scalp.  He notes no loss of consciousness with this.  He does note some ear ringing with this.  He notes no numbness or weakness.  No acute vision changes though does note may be having to strain a little bit to focus.  He notes the headache has become less intense.  His ear pain is progressively been improving.  Patient notes he has had a headache since this and it does worsen when he is trying to focus on anything.     Social History   Tobacco Use  Smoking Status Never Smoker  Smokeless Tobacco Never Used    Current Outpatient Medications on File Prior to Visit  Medication Sig Dispense Refill  . amLODipine (NORVASC) 10 MG tablet Take 1 tablet (10 mg total) by mouth daily. 90 tablet 1  . fluticasone (FLONASE) 50 MCG/ACT nasal spray Place 2 sprays into both nostrils daily. 16 g 6  . metFORMIN (GLUCOPHAGE-XR) 500 MG 24 hr tablet TAKE 4 TABLETS BY MOUTH DAILY WITH BREAKFAST 360 tablet 0  . sildenafil (VIAGRA) 50 MG tablet TAKE 1 TABLET BY MOUTH DAILY AS NEEDED FOR ERECTILE DYSFUNCTION *INSUR MAX 24 PER 90 DAYS 14 tablet 2   No current facility-administered medications on file prior to visit.     ROS see history of present illness  Objective  Physical  Exam Vitals:   08/16/20 1154 08/16/20 1206  BP: 138/80 140/80  Pulse: 91   Temp: 98.2 F (36.8 C)   SpO2: 99%     BP Readings from Last 3 Encounters:  08/16/20 140/80  03/28/20 (!) 138/100  11/10/19 (!) 126/93   Wt Readings from Last 3 Encounters:  08/16/20 224 lb 6.4 oz (101.8 kg)  03/28/20 219 lb (99.3 kg)  11/10/19 220 lb (99.8 kg)    Physical Exam Constitutional:      General: He is not in acute distress.    Appearance: He is not diaphoretic.  HENT:     Head: Normocephalic and atraumatic. No raccoon eyes or Battle's sign.     Comments: There are no apparent bony defects on his left skull    Right Ear: No hemotympanum.     Left Ear: No hemotympanum.  Cardiovascular:     Rate and Rhythm: Normal rate and regular rhythm.     Heart sounds: Normal heart sounds.  Pulmonary:     Effort: Pulmonary effort is normal.     Breath sounds: Normal breath sounds.  Skin:    General: Skin is warm and dry.  Neurological:     Mental Status: He is alert.     Comments: EOMI, PERRL, facial sensation intact V1 through V3 to light touch, hearing intact to finger rub, opens and closes eyes adequately, shoulder shrug  intact, 5/5 strength in bilateral biceps, triceps, grip, quads, hamstrings, plantar and dorsiflexion, sensation to light touch intact in bilateral UE and LE, normal gait, negative Romberg, no pronator drift, normal finger-to-nose, normal rapid alternating movements, normal heel-to-shin, patient does appear imbalanced when standing toe to heel       Assessment/Plan: Please see individual problem list.  Problem List Items Addressed This Visit    Concussion    Patient with an apparent mild concussion.  Discussed that treatment includes mental and physical rest and I have advised this at least through the weekend.  If his symptoms persist on Monday he will contact us and we can take him out of work for further duration.  If his symptoms are persistent at that time I would refer him  to our concussion clinic with our sports medicine colleagues.  Advised to seek medical attention for worsening headache or neurological symptoms.  At this time I do not believe he warrants imaging and the patient was in agreement with this.      Diabetes mellitus type 2, controlled ([place])    A1c today.  He will continue Metformin XR 2000 mg once daily.      Relevant Orders   HgB A1c   Essential hypertension - Primary    Adequately controlled at home.  He will continue his amlodipine.  I did discuss the option of switching to HCTZ given his swelling though he has declined this.  He will try to prop his feet up as the swelling may be related to some measure of venous insufficiency.  Lab work today.      Relevant Orders   Comp Met (CMET)   Family history of prostate cancer   Relevant Orders   PSA   Hyperlipidemia LDL goal <70   Relevant Orders   Lipid panel     This visit occurred during the SARS-CoV-2 public health emergency.  Safety protocols were in place, including screening questions prior to the visit, additional usage of staff PPE, and extensive cleaning of exam room while observing appropriate contact time as indicated for disinfecting solutions.    Phillip Rumps, MD [place]

## 2020-08-31 ENCOUNTER — Other Ambulatory Visit: Payer: Self-pay | Admitting: Family Medicine

## 2020-09-04 ENCOUNTER — Other Ambulatory Visit: Payer: Self-pay | Admitting: Family Medicine

## 2020-09-04 DIAGNOSIS — E119 Type 2 diabetes mellitus without complications: Secondary | ICD-10-CM

## 2020-09-04 MED ORDER — TRULICITY 0.75 MG/0.5ML ~~LOC~~ SOAJ: 0.7500 mg | SUBCUTANEOUS | 1 refills | Status: DC

## 2020-09-10 ENCOUNTER — Telehealth: Payer: Self-pay

## 2020-09-10 NOTE — Chronic Care Management (AMB) (Signed)
  Care Management   Outreach Note  09/10/2020 Name: Phillip Norman MRN: [id number] DOB: [birth year]  Referred by: Leone Haven, MD Reason for referral : Care Coordination (Outreach to schedule referral for Pharm D )   An unsuccessful telephone outreach was attempted today. The patient was referred to the case management team for assistance with care management and care coordination.   Follow Up Plan: A HIPAA compliant phone message was left for the patient providing contact information and requesting a return call.  The care management team will reach out to the patient again over the next 7 days.  If patient returns call to provider office, please advise to call [facility]  at [place], [address], [place] [address]  [place], [place] [address] Direct Dial: [phone] [email or web address]

## 2020-09-13 NOTE — Chronic Care Management (AMB) (Signed)
  Care Management   Outreach Note  09/13/2020 Name: Phillip Norman MRN: [id number] DOB: [birth year]  Referred by: Leone Haven, MD Reason for referral : Care Coordination (Outreach to schedule referral for Pharm D /Second Outreach to schedule referral for Pharm D )   A second unsuccessful telephone outreach was attempted today. The patient was referred to the case management team for assistance with care management and care coordination.   Follow Up Plan: A HIPAA compliant phone message was left for the patient providing contact information and requesting a return call.  The care management team will reach out to the patient again over the next 5 days.  If patient returns call to provider office, please advise to call [facility] at [place], [address], [place], [place] [address] Direct Dial: [phone] [email or web address]: [email or web address]

## 2020-09-16 NOTE — Chronic Care Management (AMB) (Signed)
  Care Management   Note  09/16/2020 Name: Phillip Norman MRN: [id number] DOB: [birth year]  Phillip Norman is a 50 y.o. year old male who is a primary care patient of Phillip Norman, Phillip Adam, MD. I reached out to Phillip Norman by phone today in response to a referral sent by Phillip Norman's PCP Phillip Haven, MD .    Phillip Norman was given information about care management services today including:  1. Care management services include personalized support from designated clinical staff supervised by his physician, including individualized plan of care and coordination with other care providers 2. 24/7 contact phone numbers for assistance for urgent and routine care needs. 3. The patient may stop care management services at any time by phone call to the office staff.  Patient agreed to services and verbal consent obtained.   Follow up plan: Telephone appointment with care management team member scheduled for:10/04/2020  Noreene Larsson, [place], [place], [place] [address] Direct Dial: [phone] [email or web address]

## 2020-09-24 ENCOUNTER — Other Ambulatory Visit: Payer: Self-pay | Admitting: Family Medicine

## 2020-10-04 ENCOUNTER — Ambulatory Visit: Payer: 59 | Admitting: Pharmacist

## 2020-10-04 DIAGNOSIS — E785 Hyperlipidemia, unspecified: Secondary | ICD-10-CM

## 2020-10-04 DIAGNOSIS — E1165 Type 2 diabetes mellitus with hyperglycemia: Secondary | ICD-10-CM

## 2020-10-04 DIAGNOSIS — I10 Essential (primary) hypertension: Secondary | ICD-10-CM

## 2020-10-04 MED ORDER — FREESTYLE LIBRE 2 SENSOR MISC: 11 refills | Status: DC

## 2020-10-04 NOTE — Patient Instructions (Signed)
Visit Information  PATIENT GOALS:  Goals Addressed              This Visit's Progress     Patient Stated   .  Medication Monitoring (pt-stated)        Patient Goals/Self-Care Activities . Over the next 90 days, patient will:  - take medications as prescribed check glucose three times daily using CGM, document, and provide at future appointments engage in dietary modifications by reducing carbohydrate portion sizes       Phillip Norman was given information about Care Management services by the embedded care coordination team including:  1. Care Management services include personalized support from designated clinical staff supervised by his physician, including individualized plan of care and coordination with other care providers 2. 24/7 contact phone numbers for assistance for urgent and routine care needs. 3. The patient may stop CCM services at any time (effective at the end of the month) by phone call to the office staff.  Patient agreed to services and verbal consent obtained.   Patient verbalizes understanding of instructions provided today and agrees to view in [place].   Plan: Telephone follow up appointment with care management team member scheduled for:  ~ 4 weeks  Catie Darnelle Maffucci, PharmD, [place], [place] Clinical Pharmacist [employer] at [employer] [phone]

## 2020-10-04 NOTE — Chronic Care Management (AMB) (Signed)
Care Management   Pharmacy Note  10/04/2020 Name: Phillip Norman MRN: [id number] DOB: Oct 25, 1970  Subjective: Phillip Norman is a 50 y.o. year old male who is a primary care patient of Caryl Bis, Angela Adam, MD. The Care Management team was consulted for assistance with care management and care coordination needs.    Engaged with patient by telephone for initial visit in response to provider referral for pharmacy case management and/or care coordination services.   The patient was given information about Care Management services today including:  1. Care Management services includes personalized support from designated clinical staff supervised by the patient's primary care provider, including individualized plan of care and coordination with other care providers. 2. 24/7 contact phone numbers for assistance for urgent and routine care needs. 3. The patient may stop case management services at any time by phone call to the office staff.  Patient agreed to services and consent obtained.  Assessment:  Review of patient status, including review of consultants reports, laboratory and other test data, was performed as part of comprehensive evaluation and provision of chronic care management services.   SDOH (Social Determinants of Health) assessments and interventions performed:    Objective:  Lab Results  Component Value Date   CREATININE 0.95 08/16/2020   CREATININE 0.90 09/26/2019   CREATININE 0.84 07/27/2019    Lab Results  Component Value Date   HGBA1C 11.8 (H) 08/16/2020       Component Value Date/Time   CHOL 200 08/16/2020 1211   TRIG 153.0 (H) 08/16/2020 1211   TRIG 147 04/19/2006 1403   HDL 46.90 08/16/2020 1211   CHOLHDL 4 08/16/2020 1211   VLDL 30.6 08/16/2020 1211   LDLCALC 123 (H) 08/16/2020 1211   LDLDIRECT 124.0 (H) 07/27/2019 0952      Clinical ASCVD: No  The 10-year ASCVD risk score Mikey Bussing DC Jr., et al., 2013) is: 19.1%   Values used to calculate the score:      Age: 68 years     Sex: Male     Is Non-Hispanic African American: Yes     Diabetic: Yes     Tobacco smoker: No     Systolic Blood Pressure: 448 mmHg     Is BP treated: Yes     HDL Cholesterol: 46.9 mg/dL     Total Cholesterol: 200 mg/dL      BP Readings from Last 3 Encounters:  08/16/20 140/80  03/28/20 (!) 138/100  11/10/19 (!) 126/93    Care Plan  No Known Allergies  Medications Reviewed Today    Reviewed by De Hollingshead, RPH-CPP (Pharmacist) on 10/04/20 at 14  Med List Status: <None>  Medication Order Taking? Sig Documenting Provider Last Dose Status Informant  amLODipine (NORVASC) 10 MG tablet [id number] Yes TAKE 1 TABLET BY MOUTH EVERY DAY Leone Haven, MD Taking Active   Dulaglutide (TRULICITY) 0.26 VZ/8.5YI SOPN [id number] Yes Inject 0.75 mg into the skin once a week. Leone Haven, MD Taking Active   fluticasone [facility]) 50 MCG/ACT nasal spray [id number] Yes Place 2 sprays into both nostrils daily. Leone Haven, MD Taking Active   losartan (COZAAR) 100 MG tablet [id number] Yes TAKE 1 TABLET BY MOUTH EVERY DAY Leone Haven, MD Taking Active   metFORMIN (GLUCOPHAGE-XR) 500 MG 24 hr tablet [id number] Yes TAKE 4 TABLETS BY MOUTH DAILY WITH BREAKFAST Leone Haven, MD Taking Active   sildenafil (VIAGRA) 50 MG tablet [id number] No TAKE 1 TABLET BY MOUTH DAILY AS NEEDED FOR ERECTILE DYSFUNCTION *  INSUR MAX 24 PER 90 DAYS  Patient not taking: Reported on 10/04/2020   Leone Haven, MD Not Taking Active           Patient Active Problem List   Diagnosis Date Noted  . Concussion 08/16/2020  . Left upper quadrant pain 07/26/2019  . Environmental allergies 10/19/2018  . Urinary frequency 10/19/2018  . Erectile dysfunction 02/08/2018  . Right foot pain 11/17/2017  . Overweight 06/04/2017  . Family history of prostate cancer 06/04/2017  . Anxiety 02/26/2017  . Proteinuria 06/23/2015  . Chest pain 06/11/2015  . Hyperlipidemia LDL goal  <70 06/21/2009  . Diabetes mellitus type 2, controlled ([place]) 05/15/2009  . Essential hypertension 04/01/2009  . GERD 04/01/2009  . BACK PAIN, LUMBAR, CHRONIC 04/01/2009    Conditions to be addressed/monitored: HTN, HLD and DMII  Care Plan : Medication Management  Updates made by De Hollingshead, RPH-CPP since 10/04/2020 12:00 AM    Problem: Diabetes, Hypertension     Long-Range Goal: Disease Progression Prevention   Start Date: 10/04/2020  This Visit's Progress: On track  Priority: High  Note:   Current Barriers:  . Unable to achieve control of diabetes   Pharmacist Clinical Goal(s):  Marland Kitchen Over the next 90 days, patient will achieve control of diabetes as evidenced by A1c  through collaboration with PharmD and provider.   Interventions: . 1:1 collaboration with Leone Haven, MD regarding development and update of comprehensive plan of care as evidenced by provider attestation and co-signature . Inter-disciplinary care team collaboration (see longitudinal plan of care) . Comprehensive medication review performed; medication list updated in electronic medical record  Diabetes: . Uncontrolled; current treatment metformin XR 0093 mg daily, Trulicity 8.18 mg weekly . Current glucose readings: not checking . Current meal patterns:Breakfast: shake, Lunch: ramen, [place], ham, cheese or peanut butter and jelly sandwich ; supper: wants;  zero coke;  Marland Kitchen Current exercise: none . Reviewed goal A1c, goal fasting, goal 2 hour post prandial.  . Extensive dietary discussion regarding focus on lean proteins, fruits and vegetables, whole grains. Advised against replacing 2 meals with shakes and only eating sandwiches for lunch. Will provide additional dietary education via [place] . Discussed mechanism of action, side effects of GLP1. Patient picked up a 3 month supply. Continue Trulicity 2.99 mg weekly at this time.  . Discussed Libre 2 CGM, use, benefits. Completed PA, unlikely to be  approved if patient is not on insulin. Discussed cash price, patient believes this would be worth it to him. Will provide with sample sensor to start and give instructions to connect to [place].   Hypertension: . Uncontrolled at last office visit; current treatment: losartan 100 mg daily, amlodipine 10 mg daily   . Will discuss importance of home readings moving forward.  . Discussed benefit of lower sodium consumption, increased physical activity.  . Continue current regimen at this time  Hyperlipidemia: . Uncontrolled current treatment: none . Medications previously tried: none . 10 year ASCVD risk: 20% . Will revisit recommendation of statin therapy with patient moving forward.    Patient Goals/Self-Care Activities . Over the next 90 days, patient will:  - take medications as prescribed check glucose three times daily using CGM, document, and provide at future appointments engage in dietary modifications by reducing carbohydrate portion sizes  Follow Up Plan: Telephone follow up appointment with care management team member scheduled for: ~ 6 weeks      Medication Assistance:  None required.  Patient affirms current coverage meets  needs.  Follow Up:  Patient agrees to Care Plan and Follow-up.  Plan: Telephone follow up appointment with care management team member scheduled for:  ~ 4 weeks  Catie Darnelle Maffucci, PharmD, [place], [place] Clinical Pharmacist [employer] at [employer] [phone]

## 2020-10-08 ENCOUNTER — Ambulatory Visit: Payer: 59 | Admitting: Pharmacist

## 2020-10-08 DIAGNOSIS — E1165 Type 2 diabetes mellitus with hyperglycemia: Secondary | ICD-10-CM

## 2020-10-08 DIAGNOSIS — I10 Essential (primary) hypertension: Secondary | ICD-10-CM

## 2020-10-08 DIAGNOSIS — E785 Hyperlipidemia, unspecified: Secondary | ICD-10-CM

## 2020-10-08 NOTE — Patient Instructions (Signed)
Visit Information  Goals Addressed              This Visit's Progress     Patient Stated   .  Medication Monitoring (pt-stated)        Patient Goals/Self-Care Activities . Over the next 90 days, patient will:  - take medications as prescribed check glucose three times daily using CGM, document, and provide at future appointments engage in dietary modifications by reducing carbohydrate portion sizes        Patient verbalizes understanding of instructions provided today and agrees to view in [place].  Plan: Telephone follow up appointment with care management team member scheduled for:  ~ 5 weeks as previously scheduled  Catie Darnelle Maffucci, PharmD, [place], [place] Clinical Pharmacist [employer] at [employer] [phone]

## 2020-10-08 NOTE — Chronic Care Management (AMB) (Signed)
Care Management   Pharmacy Note  10/08/2020 Name: Caton Popowski MRN: [id number] DOB: 17-Nov-1970  Subjective: Kemo Spruce is a 50 y.o. year old male who is a primary care patient of Caryl Bis, Angela Adam, MD. The Care Management team was consulted for assistance with care management and care coordination needs.    Care coordination for device access in response to provider referral for pharmacy case management and/or care coordination services.   The patient was given information about Care Management services today including:  1. Care Management services includes personalized support from designated clinical staff supervised by the patient's primary care provider, including individualized plan of care and coordination with other care providers. 2. 24/7 contact phone numbers for assistance for urgent and routine care needs. 3. The patient may stop case management services at any time by phone call to the office staff.  Patient agreed to services and consent obtained.  Assessment:  Review of patient status, including review of consultants reports, laboratory and other test data, was performed as part of comprehensive evaluation and provision of chronic care management services.   SDOH (Social Determinants of Health) assessments and interventions performed:    Objective:  Lab Results  Component Value Date   CREATININE 0.95 08/16/2020   CREATININE 0.90 09/26/2019   CREATININE 0.84 07/27/2019    Lab Results  Component Value Date   HGBA1C 11.8 (H) 08/16/2020       Component Value Date/Time   CHOL 200 08/16/2020 1211   TRIG 153.0 (H) 08/16/2020 1211   TRIG 147 04/19/2006 1403   HDL 46.90 08/16/2020 1211   CHOLHDL 4 08/16/2020 1211   VLDL 30.6 08/16/2020 1211   LDLCALC 123 (H) 08/16/2020 1211   LDLDIRECT 124.0 (H) 07/27/2019 0952    Clinical ASCVD: No  The 10-year ASCVD risk score Mikey Bussing DC Jr., et al., 2013) is: 19.1%   Values used to calculate the score:     Age: 89 years      Sex: Male     Is Non-Hispanic African American: Yes     Diabetic: Yes     Tobacco smoker: No     Systolic Blood Pressure: 856 mmHg     Is BP treated: Yes     HDL Cholesterol: 46.9 mg/dL     Total Cholesterol: 200 mg/dL     BP Readings from Last 3 Encounters:  08/16/20 140/80  03/28/20 (!) 138/100  11/10/19 (!) 126/93    Care Plan  No Known Allergies  Medications Reviewed Today    Reviewed by De Hollingshead, RPH-CPP (Pharmacist) on 10/04/20 at 31  Med List Status: <None>  Medication Order Taking? Sig Documenting Provider Last Dose Status Informant  amLODipine (NORVASC) 10 MG tablet [id number] Yes TAKE 1 TABLET BY MOUTH EVERY DAY Leone Haven, MD Taking Active   Dulaglutide (TRULICITY) 7.85 YI/5.0YD SOPN [id number] Yes Inject 0.75 mg into the skin once a week. Leone Haven, MD Taking Active   fluticasone [facility]) 50 MCG/ACT nasal spray [id number] Yes Place 2 sprays into both nostrils daily. Leone Haven, MD Taking Active   losartan (COZAAR) 100 MG tablet [id number] Yes TAKE 1 TABLET BY MOUTH EVERY DAY Leone Haven, MD Taking Active   metFORMIN (GLUCOPHAGE-XR) 500 MG 24 hr tablet [id number] Yes TAKE 4 TABLETS BY MOUTH DAILY WITH BREAKFAST Leone Haven, MD Taking Active   sildenafil (VIAGRA) 50 MG tablet [id number] No TAKE 1 TABLET BY MOUTH DAILY AS NEEDED FOR ERECTILE DYSFUNCTION *INSUR MAX 24 PER 90 DAYS  Patient not taking: Reported on 10/04/2020   Leone Haven, MD Not Taking Active           Patient Active Problem List   Diagnosis Date Noted  . Concussion 08/16/2020  . Left upper quadrant pain 07/26/2019  . Environmental allergies 10/19/2018  . Urinary frequency 10/19/2018  . Erectile dysfunction 02/08/2018  . Right foot pain 11/17/2017  . Overweight 06/04/2017  . Family history of prostate cancer 06/04/2017  . Anxiety 02/26/2017  . Proteinuria 06/23/2015  . Chest pain 06/11/2015  . Hyperlipidemia LDL goal <70 06/21/2009  .  Diabetes mellitus type 2, controlled ([place]) 05/15/2009  . Essential hypertension 04/01/2009  . GERD 04/01/2009  . BACK PAIN, LUMBAR, CHRONIC 04/01/2009    Conditions to be addressed/monitored: HTN, HLD and DMII  Care Plan : Medication Management  Updates made by De Hollingshead, RPH-CPP since 10/08/2020 12:00 AM    Problem: Diabetes, Hypertension     Long-Range Goal: Disease Progression Prevention   Start Date: 10/04/2020  This Visit's Progress: On track  Recent Progress: On track  Priority: High  Note:   Current Barriers:  . Unable to achieve control of diabetes   Pharmacist Clinical Goal(s):  Marland Kitchen Over the next 90 days, patient will achieve control of diabetes as evidenced by A1c  through collaboration with PharmD and provider.   Interventions: . 1:1 collaboration with Leone Haven, MD regarding development and update of comprehensive plan of care as evidenced by provider attestation and co-signature . Inter-disciplinary care team collaboration (see longitudinal plan of care) . Comprehensive medication review performed; medication list updated in electronic medical record  Diabetes: . Uncontrolled; current treatment metformin XR 6948 mg daily, Trulicity 5.46 mg weekly . PA for [facility] 2 sensors denied as patient is not on 3+ insulin injections daily and not monitoring glucose 4+ times daily. Plan for sample, then cash price sensors already discussed with patient. Follow for questions or concerns.   Hypertension: . Uncontrolled at last office visit; current treatment: losartan 100 mg daily, amlodipine 10 mg daily   . Will discuss importance of home readings moving forward.  . Previously discussed benefit of lower sodium consumption, increased physical activity.  . Previously recommended to continue current regimen at this time  Hyperlipidemia: . Uncontrolled current treatment: none . Medications previously tried: none . 10 year ASCVD risk: 20% . Will revisit  recommendation of statin therapy with patient moving forward.    Patient Goals/Self-Care Activities . Over the next 90 days, patient will:  - take medications as prescribed check glucose three times daily using CGM, document, and provide at future appointments engage in dietary modifications by reducing carbohydrate portion sizes  Follow Up Plan: Telephone follow up appointment with care management team member scheduled for: ~ 5 weeks as previously scheduled       Medication Assistance:  None required.  Patient affirms current coverage meets needs.  Follow Up:  Patient agrees to Care Plan and Follow-up.  Plan: Telephone follow up appointment with care management team member scheduled for:  ~ 5 weeks as previously scheduled  Catie Darnelle Maffucci, PharmD, [place], [place] Clinical Pharmacist [employer] at [employer] [phone]

## 2020-11-01 ENCOUNTER — Ambulatory Visit: Payer: 59 | Admitting: Pharmacist

## 2020-11-01 DIAGNOSIS — I10 Essential (primary) hypertension: Secondary | ICD-10-CM

## 2020-11-01 DIAGNOSIS — E785 Hyperlipidemia, unspecified: Secondary | ICD-10-CM

## 2020-11-01 DIAGNOSIS — E1165 Type 2 diabetes mellitus with hyperglycemia: Secondary | ICD-10-CM

## 2020-11-01 MED ORDER — METFORMIN HCL ER 500 MG PO TB24: ORAL_TABLET | ORAL | 1 refills | Status: DC

## 2020-11-01 NOTE — Chronic Care Management (AMB) (Signed)
Care Management   Pharmacy Note  11/01/2020 Name: Phillip Norman MRN: [id number] DOB: Dec 29, 1970  Subjective: Phillip Norman is a 49 y.o. year old male who is a primary care patient of Caryl Bis, Angela Adam, MD. The Care Management team was consulted for assistance with care management and care coordination needs.    Engaged with patient by telephone for follow up visit in response to provider referral for pharmacy case management and/or care coordination services.   The patient was given information about Care Management services today including:  Care Management services includes personalized support from designated clinical staff supervised by the patient's primary care provider, including individualized plan of care and coordination with other care providers. 24/7 contact phone numbers for assistance for urgent and routine care needs. The patient may stop case management services at any time by phone call to the office staff.  Patient agreed to services and consent obtained.  Assessment:  Review of patient status, including review of consultants reports, laboratory and other test data, was performed as part of comprehensive evaluation and provision of chronic care management services.   SDOH (Social Determinants of Health) assessments and interventions performed:  SDOH Interventions    Flowsheet Row Most Recent Value  SDOH Interventions   Financial Strain Interventions Intervention Not Indicated        Objective:  Lab Results  Component Value Date   CREATININE 0.95 08/16/2020   CREATININE 0.90 09/26/2019   CREATININE 0.84 07/27/2019    Lab Results  Component Value Date   HGBA1C 11.8 (H) 08/16/2020       Component Value Date/Time   CHOL 200 08/16/2020 1211   TRIG 153.0 (H) 08/16/2020 1211   TRIG 147 04/19/2006 1403   HDL 46.90 08/16/2020 1211   CHOLHDL 4 08/16/2020 1211   VLDL 30.6 08/16/2020 1211   LDLCALC 123 (H) 08/16/2020 1211   LDLDIRECT 124.0 (H) 07/27/2019 0952     Clinical ASCVD: No  The 10-year ASCVD risk score Mikey Bussing DC Jr., et al., 2013) is: 19.1%   Values used to calculate the score:     Age: 23 years     Sex: Male     Is Non-Hispanic African American: Yes     Diabetic: Yes     Tobacco smoker: No     Systolic Blood Pressure: 403 mmHg     Is BP treated: Yes     HDL Cholesterol: 46.9 mg/dL     Total Cholesterol: 200 mg/dL    Other: (CHADS2VASc if Afib, PHQ9 if depression, MMRC or CAT for COPD, ACT, DEXA)  BP Readings from Last 3 Encounters:  08/16/20 140/80  03/28/20 (!) 138/100  11/10/19 (!) 126/93    Care Plan  No Known Allergies  Medications Reviewed Today     Reviewed by De Hollingshead, RPH-CPP (Pharmacist) on 10/04/20 at 17  Med List Status: <None>   Medication Order Taking? Sig Documenting Provider Last Dose Status Informant  amLODipine (NORVASC) 10 MG tablet [id number] Yes TAKE 1 TABLET BY MOUTH EVERY DAY Leone Haven, MD Taking Active   Dulaglutide (TRULICITY) 8.75 IE/3.3IR SOPN [id number] Yes Inject 0.75 mg into the skin once a week. Leone Haven, MD Taking Active   fluticasone [facility]) 50 MCG/ACT nasal spray [id number] Yes Place 2 sprays into both nostrils daily. Leone Haven, MD Taking Active   losartan (COZAAR) 100 MG tablet [id number] Yes TAKE 1 TABLET BY MOUTH EVERY DAY Leone Haven, MD Taking Active   metFORMIN (GLUCOPHAGE-XR) 500 MG 24 hr  tablet [id number] Yes TAKE 4 TABLETS BY MOUTH DAILY WITH BREAKFAST Leone Haven, MD Taking Active   sildenafil (VIAGRA) 50 MG tablet [id number] No TAKE 1 TABLET BY MOUTH DAILY AS NEEDED FOR ERECTILE DYSFUNCTION *INSUR MAX 24 PER 90 DAYS  Patient not taking: Reported on 10/04/2020   Leone Haven, MD Not Taking Active             Patient Active Problem List   Diagnosis Date Noted   Concussion 08/16/2020   Left upper quadrant pain 07/26/2019   Environmental allergies 10/19/2018   Urinary frequency 10/19/2018   Erectile dysfunction  02/08/2018   Right foot pain 11/17/2017   Overweight 06/04/2017   Family history of prostate cancer 06/04/2017   Anxiety 02/26/2017   Proteinuria 06/23/2015   Chest pain 06/11/2015   Hyperlipidemia LDL goal <70 06/21/2009   Diabetes mellitus type 2, controlled ([place]) 05/15/2009   Essential hypertension 04/01/2009   GERD 04/01/2009   BACK PAIN, LUMBAR, CHRONIC 04/01/2009    Conditions to be addressed/monitored: HTN, HLD, and DMII  Care Plan : Medication Management  Updates made by De Hollingshead, RPH-CPP since 11/01/2020 12:00 AM     Problem: Diabetes, Hypertension      Long-Range Goal: Disease Progression Prevention   Start Date: 10/04/2020  This Visit's Progress: On track  Recent Progress: On track  Priority: High  Note:   Current Barriers:  Unable to achieve control of diabetes   Pharmacist Clinical Goal(s):  Over the next 90 days, patient will achieve control of diabetes as evidenced by A1c  through collaboration with PharmD and provider.   Interventions: 1:1 collaboration with Leone Haven, MD regarding development and update of comprehensive plan of care as evidenced by provider attestation and co-signature Inter-disciplinary care team collaboration (see longitudinal plan of care) Comprehensive medication review performed; medication list updated in electronic medical record  Diabetes: Uncontrolled; current treatment metformin XR 2426 mg daily, Trulicity 8.34 mg weekly Does note some GI upset (queasiness) the day he takes Trulicity. Resolves over time.  Current meal patterns: logging on [employer].  Current glucose readings: using Libre 2 CGM Date of Download: 5/28-6/10 % Time CGM is active: 72% Average Glucose: 171 mg/dL Glucose Management Indicator: 7.4  Glucose Variability: 26.5 (goal <36%) Time in Goal:  - Time in range 70-180: 66% - Time above range: 34% - Time below range: 0% Observed patterns: post prandial elevations Discussed option to  increase Trulicity. He picked up a 3 month supply on 4/14. Decided to continue current regimen at this time and consider dose increase w/ upcoming PCP visit.  Discussed incorporating more physical activity.   Hypertension: Uncontrolled at last office visit; current treatment: losartan 100 mg daily, amlodipine 10 mg daily   Patient asked if there were any interactions w/ losartan and amlodipine and his DM medications. Reviewed that there were not.  Will discuss importance of home readings moving forward.   Hyperlipidemia: Uncontrolled current treatment: none Medications previously tried: none 10 year ASCVD risk: 20% Will revisit recommendation of statin therapy with patient moving forward. Consider discussion at upcoming provider visit.   Patient Goals/Self-Care Activities Over the next 90 days, patient will:  - take medications as prescribed check glucose three times daily using CGM, document, and provide at future appointments engage in dietary modifications by reducing carbohydrate portion sizes  Follow Up Plan: Telephone follow up appointment with care management team member scheduled for: ~ 8 weeks      Medication Assistance:  None  required.  Patient affirms current coverage meets needs.  Follow Up:  Patient agrees to Care Plan and Follow-up.  Plan: Telephone follow up appointment with care management team member scheduled for:  12 weeks  Catie Darnelle Maffucci, PharmD, [place], [place] Clinical Pharmacist [employer] at [employer] [phone]

## 2020-11-01 NOTE — Patient Instructions (Signed)
Visit Information  PATIENT GOALS:  Goals Addressed               This Visit's Progress     Patient Stated     Medication Monitoring (pt-stated)        Patient Goals/Self-Care Activities Over the next 90 days, patient will:  - take medications as prescribed check glucose three times daily using CGM, document, and provide at future appointments engage in dietary modifications by reducing carbohydrate portion sizes         Patient verbalizes understanding of instructions provided today and agrees to view in [place].   Plan: Telephone follow up appointment with care management team member scheduled for:  12 weeks  Catie Darnelle Maffucci, PharmD, [place], [place] Clinical Pharmacist [employer] at [employer] [phone]

## 2020-11-18 ENCOUNTER — Ambulatory Visit: Payer: 59 | Admitting: Family Medicine

## 2020-11-18 DIAGNOSIS — Z0289 Encounter for other administrative examinations: Secondary | ICD-10-CM

## 2021-01-24 ENCOUNTER — Telehealth: Payer: 59

## 2021-01-24 ENCOUNTER — Telehealth: Payer: Self-pay | Admitting: Pharmacist

## 2021-01-24 NOTE — Telephone Encounter (Signed)
  Chronic Care Management   Note  01/24/2021 Name: Phillip Norman MRN: [id number] DOB: 1971-04-29   Attempted to contact patient for scheduled appointment for medication management support. Left HIPAA compliant message for patient to return my call at their convenience.    Plan: - If I do not hear back from the patient by end of business today, will collaborate with Care Guide to outreach to schedule follow up with me  Catie Darnelle Maffucci, PharmD, [place], [place] Pharmacist [employer] at [employer] [facility] [phone]

## 2021-01-29 ENCOUNTER — Other Ambulatory Visit: Payer: Self-pay | Admitting: Family Medicine

## 2021-01-29 DIAGNOSIS — E119 Type 2 diabetes mellitus without complications: Secondary | ICD-10-CM

## 2021-01-31 NOTE — Telephone Encounter (Signed)
Pt has been rescheduled. 

## 2021-03-09 ENCOUNTER — Other Ambulatory Visit: Payer: Self-pay | Admitting: Family Medicine

## 2021-03-21 ENCOUNTER — Ambulatory Visit: Payer: 59 | Admitting: Pharmacist

## 2021-03-21 DIAGNOSIS — E1165 Type 2 diabetes mellitus with hyperglycemia: Secondary | ICD-10-CM

## 2021-03-21 DIAGNOSIS — E785 Hyperlipidemia, unspecified: Secondary | ICD-10-CM

## 2021-03-21 DIAGNOSIS — I10 Essential (primary) hypertension: Secondary | ICD-10-CM

## 2021-03-21 MED ORDER — FREESTYLE LIBRE 3 SENSOR MISC: 1.0000 | Freq: Every day | 11 refills | Status: DC

## 2021-03-21 NOTE — Patient Instructions (Signed)
Captain,   It was great talking to you today!  Please restart using the [place]. I sent a prescription for the updated version - Libre 3.   Continue metformin 4 tablets daily and Trulicity 5.28 mg weekly for now. We will see how your lab work looks and decide about increasing the dose from there.   We recommend you get the influenza vaccine for this season.   We recommend you get the updated bivalent COVID-19 booster, at least 2 months after any prior doses. You may consider delaying a booster dose by 3 months from a prior episode of COVID-19 per the CDC.   You can find pharmacies that have this formulation in stock at [email or web address].   Take care!  Catie Darnelle Maffucci, PharmD  Visit Information  PATIENT GOALS:  Goals Addressed               This Visit's Progress     Patient Stated     Medication Monitoring (pt-stated)        Patient Goals/Self-Care Activities Over the next 90 days, patient will:  - take medications as prescribed check glucose three times daily using CGM, document, and provide at future appointments engage in dietary modifications by reducing carbohydrate portion sizes         Patient verbalizes understanding of instructions provided today and agrees to view in [place].    Plan: Face to Face appointment with care management team member scheduled for: 4 weeks  Catie Darnelle Maffucci, PharmD, [place], [place] Clinical Pharmacist [employer] at [employer] [phone]

## 2021-03-21 NOTE — Chronic Care Management (AMB) (Signed)
Chronic Care Management Pharmacy Note  03/21/2021 Name:  Janai Brannigan MRN:  [id number] DOB:  1970-10-12  Subjective: Colbi Staubs is an 50 y.o. year old male who is a primary patient of Sonnenberg, Angela Adam, MD.  The CCM team was consulted for assistance with disease management and care coordination needs.    Engaged with patient by telephone for follow up visit for pharmacy case management and/or care coordination services.   Objective:  Medications Reviewed Today     Reviewed by De Hollingshead, RPH-CPP (Pharmacist) on 10/04/20 at 1405  Med List Status: <None>   Medication Order Taking? Sig Documenting Provider Last Dose Status Informant  amLODipine (NORVASC) 10 MG tablet [id number] Yes TAKE 1 TABLET BY MOUTH EVERY DAY Leone Haven, MD Taking Active   Dulaglutide (TRULICITY) 2.97 LG/9.2JJ SOPN [id number] Yes Inject 0.75 mg into the skin once a week. Leone Haven, MD Taking Active   fluticasone [facility]) 50 MCG/ACT nasal spray [id number] Yes Place 2 sprays into both nostrils daily. Leone Haven, MD Taking Active   losartan (COZAAR) 100 MG tablet [id number] Yes TAKE 1 TABLET BY MOUTH EVERY DAY Leone Haven, MD Taking Active   metFORMIN (GLUCOPHAGE-XR) 500 MG 24 hr tablet [id number] Yes TAKE 4 TABLETS BY MOUTH DAILY WITH BREAKFAST Leone Haven, MD Taking Active   sildenafil (VIAGRA) 50 MG tablet [id number] No TAKE 1 TABLET BY MOUTH DAILY AS NEEDED FOR ERECTILE DYSFUNCTION *INSUR MAX 24 PER 90 DAYS  Patient not taking: Reported on 10/04/2020   Leone Haven, MD Not Taking Active             Lab Results  Component Value Date   HGBA1C 11.8 (H) 08/16/2020   Lab Results  Component Value Date   CREATININE 0.95 08/16/2020   BUN 12 08/16/2020   NA 140 08/16/2020   K 4.0 08/16/2020   CL 101 08/16/2020   CO2 28 08/16/2020   Lab Results  Component Value Date   CHOL 200 08/16/2020   HDL 46.90 08/16/2020   LDLCALC 123 (H) 08/16/2020   LDLDIRECT  124.0 (H) 07/27/2019   TRIG 153.0 (H) 08/16/2020   CHOLHDL 4 08/16/2020     Assessment/Interventions: Review of patient past medical history, allergies, medications, health status, including review of consultants reports, laboratory and other test data, was performed as part of comprehensive evaluation and provision of chronic care management services.   SDOH:  (Social Determinants of Health) assessments and interventions performed: No SDOH Interventions    Flowsheet Row Most Recent Value  SDOH Interventions   Financial Strain Interventions Intervention Not Indicated        CCM Care Plan  Review of patient past medical history, allergies, medications, health status, including review of consultants reports, laboratory and other test data, was performed as part of comprehensive evaluation and provision of chronic care management services.   Conditions to be addressed/monitored:  Hypertension, Hyperlipidemia, and Diabetes  Care Plan : Medication Management  Updates made by De Hollingshead, RPH-CPP since 03/21/2021 12:00 AM     Problem: Diabetes, Hypertension      Long-Range Goal: Disease Progression Prevention   Start Date: 10/04/2020  This Visit's Progress: On track  Recent Progress: On track  Priority: High  Note:   Current Barriers:  Unable to achieve control of diabetes   Pharmacist Clinical Goal(s):  Over the next 90 days, patient will achieve control of diabetes as evidenced by A1c  through collaboration with PharmD and provider.  Interventions: 1:1 collaboration with Leone Haven, MD regarding development and update of comprehensive plan of care as evidenced by provider attestation and co-signature Inter-disciplinary care team collaboration (see longitudinal plan of care) Comprehensive medication review performed; medication list updated in electronic medical record  Diabetes: Uncontrolled; current treatment metformin XR 5686 mg daily, Trulicity 1.68  mg weekly - reports he was off for ~ 2 weeks due to insurance issues. Reports he is back on therapy.  Current glucose readings: off Libre 2 Reports lots going on lately with work and upcoming wedding of his daughter. Discussed importance of getting back on track.  Discussed Libre 3 CGM. Patient interested in upgrade. Order sent today.  Given lack of BG readings, continue current regimen. Scheduled for A1c and CMP in ~ 2 weeks. Scheduled follow up with me in office shortly thereafter.   Hypertension: Uncontrolled at last office visit; current treatment: losartan 100 mg daily, amlodipine 10 mg daily Previously recommended to continue current regimen at this time. Will check BP at upcoming face to face   Hyperlipidemia: Uncontrolled current treatment: none Medications previously tried: none 10 year ASCVD risk: 20% Will revisit recommendation of statin therapy with patient moving forward.    Patient Goals/Self-Care Activities Over the next 90 days, patient will:  - take medications as prescribed check glucose three times daily using CGM, document, and provide at future appointments engage in dietary modifications by reducing carbohydrate portion sizes  Follow Up Plan: Face to Face appointment with care management team member scheduled for:  ~ 2 weeks       Plan: Face to Face appointment with care management team member scheduled for: 4 weeks  Catie Darnelle Maffucci, PharmD, [place], [place] Pharmacist [employer] at [employer] [phone]

## 2021-03-30 ENCOUNTER — Other Ambulatory Visit: Payer: Self-pay | Admitting: Family Medicine

## 2021-04-03 ENCOUNTER — Other Ambulatory Visit (INDEPENDENT_AMBULATORY_CARE_PROVIDER_SITE_OTHER): Payer: 59

## 2021-04-03 ENCOUNTER — Other Ambulatory Visit: Payer: Self-pay

## 2021-04-03 DIAGNOSIS — E1165 Type 2 diabetes mellitus with hyperglycemia: Secondary | ICD-10-CM

## 2021-04-03 LAB — COMPREHENSIVE METABOLIC PANEL
ALT: 10 U/L (ref 0–53)
AST: 15 U/L (ref 0–37)
Albumin: 4.2 g/dL (ref 3.5–5.2)
Alkaline Phosphatase: 59 U/L (ref 39–117)
BUN: 12 mg/dL (ref 6–23)
CO2: 27 mEq/L (ref 19–32)
Calcium: 8.9 mg/dL (ref 8.4–10.5)
Chloride: 104 mEq/L (ref 96–112)
Creatinine, Ser: 1.02 mg/dL (ref 0.40–1.50)
GFR: 85.51 mL/min (ref >60.00)
Glucose, Bld: 142 mg/dL — ABNORMAL HIGH (ref 70–99)
Potassium: 4 mEq/L (ref 3.5–5.1)
Sodium: 139 mEq/L (ref 135–145)
Total Bilirubin: 0.4 mg/dL (ref 0.2–1.2)
Total Protein: 6.9 g/dL (ref 6.0–8.3)

## 2021-04-03 LAB — HEMOGLOBIN A1C: Hgb A1c MFr Bld: 9.4 % — ABNORMAL HIGH (ref 4.6–6.5)

## 2021-04-21 ENCOUNTER — Other Ambulatory Visit: Payer: Self-pay

## 2021-04-21 ENCOUNTER — Ambulatory Visit: Payer: 59 | Admitting: Pharmacist

## 2021-04-21 VITALS — BP 118/72 | Temp 98.6°F | Wt 229.0 lb

## 2021-04-21 DIAGNOSIS — E1165 Type 2 diabetes mellitus with hyperglycemia: Secondary | ICD-10-CM

## 2021-04-21 MED ORDER — TRULICITY 1.5 MG/0.5ML ~~LOC~~ SOAJ: 1.5000 mg | SUBCUTANEOUS | 1 refills | Status: DC

## 2021-04-21 NOTE — Patient Instructions (Signed)
[place],   Keep up the great work.   Increase Trulicity to 1.5 mg weekly. You can inject 2 of the 0.75 mg pens one after the other to use your current supply. Continue metformin XR 4 tablets daily. Let me know if you have any issues with severe stomach upset, nausea, or constipation.    Our goal time in range is >70%. Our goal fasting readings are less than 130 and goal 2 hour after meal readings are less than 180.   I like Premier Protein shakes. Ensure protein shakes are also low carbohydrate. I would recommend focusing on lean proteins, vegetables, appropriate serving sizes of fruit, and whole grains when you are eating carbs (whole grain breads, rice, pasta).   Take care!  Catie Darnelle Maffucci, PharmD  Visit Information  Following are the goals we discussed today:  Plan: Telephone follow up appointment with care management team member scheduled for:  6 weeks  Catie Darnelle Maffucci, PharmD, [place], CPP Clinical Pharmacist [place] at [facility] [phone]  Please call the care guide team at [phone] if you need to cancel or reschedule your appointment.   Print copy of patient instructions, educational materials, and care plan provided in person.

## 2021-04-21 NOTE — Chronic Care Management (AMB) (Signed)
Chronic Care Management CCM Pharmacy Note  04/21/2021 Name:  Phillip Norman MRN:  [id number] DOB:  1971-01-17  Summary: - A1c improved, but not at goal.   Recommendations/Changes made from today's visit: - Increase Trulicity to 1.5 mg weekly. Start Libre 3 CGM  Subjective: Phillip Norman is an 50 y.o. year old male who is a primary patient of Sonnenberg, Angela Adam, MD.  The CCM team was consulted for assistance with disease management and care coordination needs.    Engaged with patient face to face for follow up visit for pharmacy case management and/or care coordination services.   Objective:  Medications Reviewed Today     Reviewed by De Hollingshead, RPH-CPP (Pharmacist) on 04/21/21 at 1615  Med List Status: <None>   Medication Order Taking? Sig Documenting Provider Last Dose Status Informant  amLODipine (NORVASC) 10 MG tablet [id number] Yes TAKE 1 TABLET BY MOUTH EVERY DAY Leone Haven, MD Taking Active   Continuous Blood Gluc Sensor (FREESTYLE LIBRE 3 SENSOR) [place] [id number] No 1 each by Does not apply route daily at 2 PM. Place 1 sensor on the skin every 14 days. Use to check glucose continuously  Patient not taking: Reported on 04/21/2021   Leone Haven, MD Not Taking Active   fluticasone [facility]) 50 MCG/ACT nasal spray [id number] Yes Place 2 sprays into both nostrils daily. Leone Haven, MD Taking Active   losartan (COZAAR) 100 MG tablet [id number] Yes TAKE 1 TABLET BY MOUTH EVERY DAY Leone Haven, MD Taking Active   metFORMIN (GLUCOPHAGE-XR) 500 MG 24 hr tablet [id number] Yes TAKE 4 TABLETS BY MOUTH DAILY WITH BREAKFAST Leone Haven, MD Taking Active   sildenafil (VIAGRA) 50 MG tablet [id number] Yes TAKE 1 TABLET BY MOUTH DAILY AS NEEDED FOR ERECTILE DYSFUNCTION *INSUR MAX 24 PER 90 DAYS Leone Haven, MD Taking Active   TRULICITY 2.11 HE/1.7EY SOPN [id number] Yes INJECT 0.75 MG INTO THE SKIN ONCE A WEEK. Leone Haven, MD Taking Active              Pertinent Labs:   Lab Results  Component Value Date   HGBA1C 9.4 (H) 04/03/2021   Lab Results  Component Value Date   CHOL 200 08/16/2020   HDL 46.90 08/16/2020   LDLCALC 123 (H) 08/16/2020   LDLDIRECT 124.0 (H) 07/27/2019   TRIG 153.0 (H) 08/16/2020   CHOLHDL 4 08/16/2020   Lab Results  Component Value Date   CREATININE 1.02 04/03/2021   BUN 12 04/03/2021   NA 139 04/03/2021   K 4.0 04/03/2021   CL 104 04/03/2021   CO2 27 04/03/2021    SDOH:  (Social Determinants of Health) assessments and interventions performed:  SDOH Interventions    Flowsheet Row Most Recent Value  SDOH Interventions   Financial Strain Interventions Intervention Not Indicated       CCM Care Plan  Review of patient past medical history, allergies, medications, health status, including review of consultants reports, laboratory and other test data, was performed as part of comprehensive evaluation and provision of chronic care management services.   Care Plan : Medication Management  Updates made by De Hollingshead, RPH-CPP since 04/21/2021 12:00 AM     Problem: Diabetes, Hypertension      Long-Range Goal: Disease Progression Prevention   Start Date: 10/04/2020  Recent Progress: On track  Priority: High  Note:   Current Barriers:  Unable to achieve control of diabetes   Pharmacist Clinical Goal(s):  Over the next  90 days, patient will achieve control of diabetes as evidenced by A1c  through collaboration with PharmD and provider.   Interventions: 1:1 collaboration with Leone Haven, MD regarding development and update of comprehensive plan of care as evidenced by provider attestation and co-signature Inter-disciplinary care team collaboration (see longitudinal plan of care) Comprehensive medication review performed; medication list updated in electronic medical record  Diabetes: Uncontrolled but improving; current treatment metformin XR 8938 mg daily, Trulicity  1.01 mg weekly Current meal patterns: lunch: chicken, okra; supper: greens, peanut butter jelly; reports he is trying to focus on more salad; drinks: water, half and half tea that is watered down;  Current physical activity: none lately  Current glucose readings: using Libre 2, plans to upgrade to [place] 3 Date of Download: 11/3-11/16/22 % Time CGM is active: 62% Average Glucose: 183 mg/dL Glucose Management Indicator: 7.7  Glucose Variability: 20.4 (goal <36%) Time in Goal:  - Time in range 70-180: 55% - Time above range: 45% - Time below range: 0% Discussed elevated A1c and glucose readings. Increase Trulicity to 1.5 mg weekly. Counseled on increased risk of GI side effects. Continue metformin XR 2000 mg daily.  Extensive dietary counseling. Discussed incorporation of proteins.   Hypertension: Controlled today; current treatment: losartan 100 mg daily, amlodipine 10 mg daily Continue current regimen at this time  Hyperlipidemia: Uncontrolled current treatment: none Medications previously tried: none 10 year ASCVD risk: 20% Will revisit recommendation of statin therapy with patient moving forward.    Patient Goals/Self-Care Activities Over the next 90 days, patient will:  - take medications as prescribed check glucose three times daily using CGM, document, and provide at future appointments engage in dietary modifications by reducing carbohydrate portion sizes      Plan: Telephone follow up appointment with care management team member scheduled for:  6 weeks  Catie Darnelle Maffucci, PharmD, [place], [place] Pharmacist [employer] at [employer] [phone]

## 2021-04-25 ENCOUNTER — Other Ambulatory Visit: Payer: Self-pay | Admitting: Family Medicine

## 2021-04-25 DIAGNOSIS — E1165 Type 2 diabetes mellitus with hyperglycemia: Secondary | ICD-10-CM

## 2021-05-20 ENCOUNTER — Encounter: Payer: Self-pay | Admitting: Family Medicine

## 2021-05-20 ENCOUNTER — Other Ambulatory Visit: Payer: Self-pay

## 2021-05-20 ENCOUNTER — Telehealth (INDEPENDENT_AMBULATORY_CARE_PROVIDER_SITE_OTHER): Payer: 59 | Admitting: Family Medicine

## 2021-05-20 DIAGNOSIS — E785 Hyperlipidemia, unspecified: Secondary | ICD-10-CM

## 2021-05-20 DIAGNOSIS — I10 Essential (primary) hypertension: Secondary | ICD-10-CM | POA: Diagnosis not present

## 2021-05-20 DIAGNOSIS — E1165 Type 2 diabetes mellitus with hyperglycemia: Secondary | ICD-10-CM | POA: Diagnosis not present

## 2021-05-20 NOTE — Assessment & Plan Note (Signed)
Uncontrolled.  He notes the Trulicity is excessively expensive.  I will check with our clinical pharmacist regarding assistance options or possible cheaper alternatives for him.  For now he will continue Trulicity 1.5 mg weekly and metformin XR 2000 mg daily.

## 2021-05-20 NOTE — Progress Notes (Signed)
Virtual Visit via telephone Note  This visit type was conducted due to national recommendations for restrictions regarding the COVID-19 pandemic (e.g. social distancing).  This format is felt to be most appropriate for this patient at this time.  All issues noted in this document were discussed and addressed.  No physical exam was performed (except for noted visual exam findings with Video Visits).   I connected with Phillip Norman today at  9:30 AM EST by telephone and verified that I am speaking with the correct person using two identifiers. Location patient: home Location provider: work Persons participating in the virtual visit: patient, provider  I discussed the limitations, risks, security and privacy concerns of performing an evaluation and management service by telephone and the availability of in person appointments. I also discussed with the patient that there may be a patient responsible charge related to this service. The patient expressed understanding and agreed to proceed.  Interactive audio and video telecommunications were attempted between this provider and patient, however failed, due to patient having technical difficulties OR patient did not have access to video capability.  We continued and completed visit with audio only.   Reason for visit: f/u.  HPI: HYPERTENSION Disease Monitoring Home BP Monitoring not checking Chest pain- no    Dyspnea- no Medications Compliance-  taking amlodipine, losartan.   Edema- yes, bilateral feet, resolves with elevation of his legs BMET    Component Value Date/Time   NA 139 04/03/2021 1406   NA 140 08/27/2015 0935   K 4.0 04/03/2021 1406   CL 104 04/03/2021 1406   CO2 27 04/03/2021 1406   GLUCOSE 142 (H) 04/03/2021 1406   GLUCOSE 115 (H) 04/19/2006 1403   BUN 12 04/03/2021 1406   BUN 10 08/27/2015 0935   CREATININE 1.02 04/03/2021 1406   CREATININE 0.88 06/04/2017 1620   CALCIUM 8.9 04/03/2021 1406   GFRNONAA >60 09/26/2019  0849   GFRAA >60 09/26/2019 0849   DIABETES Disease Monitoring: Blood Sugar ranges-250 post prandial, 140-160 fasting Polyuria/phagia/dipsia- no      Optho- due Medications: Compliance- taking trulicity (expensive), metformin Hypoglycemic symptoms- no  Hyperlipidemia: no medications.    ROS: See pertinent positives and negatives per HPI.  Past Medical History:  Diagnosis Date   Chronic back pain    Diabetes mellitus without complication (HCC)    GERD (gastroesophageal reflux disease)    Hyperlipidemia    Hypertension     Past Surgical History:  Procedure Laterality Date   COLONOSCOPY WITH PROPOFOL N/A 11/10/2019   Procedure: COLONOSCOPY WITH PROPOFOL;  Surgeon: Jonathon Bellows, MD;  Location: [facility] ENDOSCOPY;  Service: Gastroenterology;  Laterality: N/A;   TONSILLECTOMY AND ADENOIDECTOMY      Family History  Problem Relation Age of Onset   Hypertension Mother    Diabetes Father    Breast cancer Paternal Aunt    Diabetes Paternal Aunt    Prostate cancer Paternal Uncle     SOCIAL HX: Non-smoker   Current Outpatient Medications:    amLODipine (NORVASC) 10 MG tablet, TAKE 1 TABLET BY MOUTH EVERY DAY, Disp: 90 tablet, Rfl: 1   Continuous Blood Gluc Sensor (FREESTYLE LIBRE 3 SENSOR) [place], 1 each by Does not apply route daily at 2 PM. Place 1 sensor on the skin every 14 days. Use to check glucose continuously, Disp: 2 each, Rfl: 11   Dulaglutide (TRULICITY) 1.5 EN/2.7PO SOPN, Inject 1.5 mg into the skin once a week., Disp: 6 mL, Rfl: 1   fluticasone (FLONASE) 50  MCG/ACT nasal spray, Place 2 sprays into both nostrils daily., Disp: 16 g, Rfl: 6   losartan (COZAAR) 100 MG tablet, TAKE 1 TABLET BY MOUTH EVERY DAY, Disp: 90 tablet, Rfl: 1   metFORMIN (GLUCOPHAGE-XR) 500 MG 24 hr tablet, TAKE 4 TABLETS BY MOUTH DAILY WITH BREAKFAST., Disp: 360 tablet, Rfl: 1   sildenafil (VIAGRA) 50 MG tablet, TAKE 1 TABLET BY MOUTH DAILY AS NEEDED FOR ERECTILE DYSFUNCTION *INSUR MAX 24 PER 90 DAYS,  Disp: 14 tablet, Rfl: 2  EXAM: This was a telephone visit and thus no exam was completed.  ASSESSMENT AND PLAN:  Discussed the following assessment and plan:  Problem List Items Addressed This Visit     Essential hypertension    Undetermined control though he reports he had this checked in November when he was here to see our pharmacist and notes it was in the 120s over 76s.  He will continue losartan 100 mg daily and amlodipine 10 mg daily.  I did discuss the option of switching from amlodipine to something different though he prefers to stay on the amlodipine.  He will elevate his legs is much as he is able to do help with the swelling.  If he changes his mind on changing the amlodipine he will let us know.      Relevant Orders   Comp Met (CMET)   Hyperlipidemia LDL goal <70    The patient was counseled on the need for a statin.  Discussed that given his diabetes his goal LDL is less than 70.  Discussed risk reduction for cardiovascular disease with an LDL less than 70 as well as risk reduction from the statin itself.  The patient wants to work on diet and exercise.  We will recheck in 3 months and discuss this issue again.  I did discuss common side effects including myalgias, arthralgias, and elevated LFTs with statin therapy.      Relevant Orders   Comp Met (CMET)   LDL cholesterol, direct   Uncontrolled type 2 diabetes mellitus with hyperglycemia (HCC)    Uncontrolled.  He notes the Trulicity is excessively expensive.  I will check with our clinical pharmacist regarding assistance options or possible cheaper alternatives for him.  For now he will continue Trulicity 1.5 mg weekly and metformin XR 2000 mg daily.      Relevant Orders   Hemoglobin A1c    Return in about 3 months (around 08/18/2021) for diabetes, hyperlipidemia, labs 2 days prior.  The patient was advised to contact the office by 05/23/2021 if he has not heard about an appointment being scheduled.   I discussed the  assessment and treatment plan with the patient. The patient was provided an opportunity to ask questions and all were answered. The patient agreed with the plan and demonstrated an understanding of the instructions.   The patient was advised to call back or seek an in-person evaluation if the symptoms worsen or if the condition fails to improve as anticipated.  I provided 14 minutes of non-face-to-face time during this encounter.   [name], MD

## 2021-05-20 NOTE — Assessment & Plan Note (Signed)
Undetermined control though he reports he had this checked in November when he was here to see our pharmacist and notes it was in the 120s over 29s.  He will continue losartan 100 mg daily and amlodipine 10 mg daily.  I did discuss the option of switching from amlodipine to something different though he prefers to stay on the amlodipine.  He will elevate his legs is much as he is able to do help with the swelling.  If he changes his mind on changing the amlodipine he will let us know.

## 2021-05-20 NOTE — Assessment & Plan Note (Signed)
The patient was counseled on the need for a statin.  Discussed that given his diabetes his goal LDL is less than 70.  Discussed risk reduction for cardiovascular disease with an LDL less than 70 as well as risk reduction from the statin itself.  The patient wants to work on diet and exercise.  We will recheck in 3 months and discuss this issue again.  I did discuss common side effects including myalgias, arthralgias, and elevated LFTs with statin therapy.

## 2021-05-21 ENCOUNTER — Telehealth: Payer: Self-pay | Admitting: Pharmacist

## 2021-05-21 NOTE — Telephone Encounter (Signed)
Patient reported excessive copay on Trulicity to PCP yesterday.   Called pharmacy. Copay was $331 for 3 month supply. It was not run through savings card. Contacted patient, left voicemail for him to visit manufacturer website to download savings card. Will send information in MyChart message as well.

## 2021-06-04 ENCOUNTER — Telehealth: Payer: 59

## 2021-06-24 ENCOUNTER — Telehealth: Payer: 59

## 2021-06-24 ENCOUNTER — Telehealth: Payer: Self-pay | Admitting: Pharmacist

## 2021-06-24 NOTE — Telephone Encounter (Signed)
°  Chronic Care Management   Note  06/24/2021 Name: Jumaane Weatherford MRN: [id number] DOB: [birth year]   Attempted to contact patient for scheduled appointment for medication management support. Left HIPAA compliant message for patient to return my call at their convenience.    Plan: - If I do not hear back from the patient by end of business today, will collaborate with Care Guide to outreach to schedule follow up with me   Catie Darnelle Maffucci, PharmD, [place], [place] Pharmacist [employer] at [employer] [phone]

## 2021-06-25 ENCOUNTER — Telehealth: Payer: Self-pay

## 2021-06-25 NOTE — Chronic Care Management (AMB) (Signed)
°  Care Management   Note  06/25/2021 Name: Jaman Aro MRN: [id number] DOB: [birth year]  Nikolai Wilczak is a 51 y.o. year old male who is a primary care patient of Leone Haven, MD and is actively engaged with the care management team. I reached out to [name] by phone today to assist with re-scheduling a follow up visit with the Pharmacist  Follow up plan: Unsuccessful telephone outreach attempt made. A HIPAA compliant phone message was left for the patient providing contact information and requesting a return call.  The care management team will reach out to the patient again over the next 3 days.  If patient returns call to provider office, please advise to call [facility]  at [facility], [place], [place], [place] [address] Direct Dial: [phone] [email or web address] Website: [email or web address]

## 2021-07-02 NOTE — Chronic Care Management (AMB) (Signed)
°  Care Management   Note  07/02/2021 Name: Phillip Norman MRN: [id number] DOB: 30-Jul-1970  Phillip Norman is a 51 y.o. year old male who is a primary care patient of Caryl Bis, Angela Adam, MD and is actively engaged with the care management team. I reached out to [name] by phone today to assist with re-scheduling a follow up visit with the Pharmacist  Follow up plan: Telephone appointment with care management team member scheduled for:07/18/2021  Noreene Larsson, [place], [place], [place] [address] Direct Dial: [phone] [email or web address] Website: [email or web address]

## 2021-07-17 ENCOUNTER — Other Ambulatory Visit: Payer: Self-pay | Admitting: Family Medicine

## 2021-07-17 DIAGNOSIS — E119 Type 2 diabetes mellitus without complications: Secondary | ICD-10-CM

## 2021-07-18 ENCOUNTER — Ambulatory Visit: Payer: 59 | Admitting: Pharmacist

## 2021-07-18 DIAGNOSIS — I10 Essential (primary) hypertension: Secondary | ICD-10-CM

## 2021-07-18 DIAGNOSIS — E1165 Type 2 diabetes mellitus with hyperglycemia: Secondary | ICD-10-CM

## 2021-07-18 MED ORDER — VALSARTAN 160 MG PO TABS: 160.0000 mg | ORAL_TABLET | Freq: Every day | ORAL | 3 refills | Status: DC

## 2021-07-18 MED ORDER — AMLODIPINE BESYLATE 5 MG PO TABS: 5.0000 mg | ORAL_TABLET | Freq: Every day | ORAL | 1 refills | Status: DC

## 2021-07-18 NOTE — Progress Notes (Signed)
Chief Complaint  Patient presents with   Care Coordination    Follow Up    Phillip Norman is a 51 y.o. year old male who was referred for medication management by their primary care provider, Caryl Bis, Angela Adam, MD. They presented for a telephone visit in the context of the COVID-19 pandemic.    Subjective: Diabetes:  Current medications: metformin XR 8882 mg daily, Trulicity 1.5 mg weekly- has been taking 0.75 mg because he never picked up the 1.5 mg dose.   Current glucose readings: has not been using Elenor Legato as he has been taking Vitamin C 1000 mg daily. Has not been checking fingersticks as he thought the Vitamin C would interfere with those as well.   Patient denies hyperglycemic symptoms including polyuria, polydipsia, polyphagia, nocturia, neuropathy, blurred vision.  Current meal patterns: did a no-bread diet for the first part of February. Has now moved into a more "normal" diet with a lower carb content  Current physical activity: long work hours, has been unable to exercise  Hypertension:  Current medications: losartan 100 mg daily, amlodipine 10 mg daily   Does report troublesome lower extremity edema. Admits that he stays at his desk most of the time.   Current blood pressure readings readings: not checking at home regularly, but reports readings ~ 125/80    Objective: Lab Results  Component Value Date   HGBA1C 9.4 (H) 04/03/2021    Lab Results  Component Value Date   CREATININE 1.02 04/03/2021   BUN 12 04/03/2021   NA 139 04/03/2021   K 4.0 04/03/2021   CL 104 04/03/2021   CO2 27 04/03/2021    Lab Results  Component Value Date   CHOL 200 08/16/2020   HDL 46.90 08/16/2020   LDLCALC 123 (H) 08/16/2020   LDLDIRECT 124.0 (H) 07/27/2019   TRIG 153.0 (H) 08/16/2020   CHOLHDL 4 08/16/2020    Medications Reviewed Today     Reviewed by De Hollingshead, RPH-CPP (Pharmacist) on 07/18/21 at 1317  Med List Status: <None>   Medication Order  Taking? Sig Documenting Provider Last Dose Status Informant  amLODipine (NORVASC) 10 MG tablet [id number] Yes TAKE 1 TABLET BY MOUTH EVERY DAY Leone Haven, MD Taking Active   Continuous Blood Gluc Sensor (FREESTYLE LIBRE 3 SENSOR) [place] [id number] No 1 each by Does not apply route daily at 2 PM. Place 1 sensor on the skin every 14 days. Use to check glucose continuously  Patient not taking: Reported on 07/18/2021   Leone Haven, MD Not Taking Active   Dulaglutide (TRULICITY) 1.5 IA/1.6PV [facility] [id number] No Inject 1.5 mg into the skin once a week.  Patient not taking: Reported on 07/18/2021   Leone Haven, MD Not Taking Active            Med Note De Hollingshead   Fri Jul 18, 2021  1:07 PM) Taking 0.75 mg   fluticasone (FLONASE) 50 MCG/ACT nasal spray [id number] Yes Place 2 sprays into both nostrils daily. Leone Haven, MD Taking Active   losartan (COZAAR) 100 MG tablet [id number] Yes TAKE 1 TABLET BY MOUTH EVERY DAY Leone Haven, MD Taking Active   metFORMIN (GLUCOPHAGE-XR) 500 MG 24 hr tablet [id number] Yes TAKE 4 TABLETS BY MOUTH DAILY WITH BREAKFAST. Leone Haven, MD Taking Active   sildenafil (VIAGRA) 50 MG tablet [id number] Yes TAKE 1 TABLET BY MOUTH DAILY AS NEEDED FOR ERECTILE DYSFUNCTION *INSUR MAX 24 PER 90 DAYS Sonnenberg,  Angela Adam, MD Taking Active             Assessment/Plan:   Diabetes: - Currently uncontrolled - Reviewed that doses of Vitamin C >500 mg can reduce accuracy of Libre readings. He will reduce Vitamin C to 500 mg daily and restart Libre monitoring - Recommend to increase Trulicity to 1.5 mg weekly as previously discussed. He can inject 2 of the 0.75 mg doses to equal 1.5 mg, as he has a 2 month supply at home - Reviewed availability of Trulicity savings card on manufacturer website - Continue metformin XR 2000 mg daily   Hypertension: - Currently controlled but with side effects - Reduce amlodipine to 5 mg daily. Switch losartan  100 mg daily to valsartan 160 mg daily. BMP ordered and scheduled in 2 weeks. Patient will monitor BP at home.   Follow Up Plan: visit in 4 weeks  Catie Darnelle Maffucci, PharmD, [place], CPP Clinical Pharmacist [employer] at [employer] [phone]

## 2021-07-18 NOTE — Patient Instructions (Signed)
Anival,   Increase Trulicity to 1.5 mg weekly. You can inject two doses of 0.75 mg each week to equal 1.5 mg. Continue metformin XR 4 tablets daily.   Restart using the [place] sensor. Reduce your dose of Vitamin C to 500 mg daily.   For a goal A1c of less than 7%, goal fasting readings are less than 130 and goal 2 hour after meal readings are less than 180.   Stop losartan. Reduce amlodipine to 5 mg daily (1/2 of a 10 mg tablet). Start valsartan 160 mg daily. Come to your nonfasting lab appointment in 2 weeks.   Continue to check your blood pressure at home. Our goal blood pressure is less than 130/80.   Take care,   Catie Darnelle Maffucci, PharmD

## 2021-07-28 ENCOUNTER — Ambulatory Visit: Payer: Self-pay | Admitting: Pharmacist

## 2021-07-28 NOTE — Chronic Care Management (AMB) (Signed)
?  Chronic Care Management  ? ?Note ? ?07/28/2021 ?Name: Reece Fehnel MRN: [id number] DOB: November 21, 1970 ? ? ? ?Closing pharmacy CCM case at this time. Will collaborate with Care Guide to outreach to schedule follow up with RN CM. Patient has clinic contact information for future questions or concerns.  ? ?Catie Darnelle Maffucci, PharmD, [place], CPP ?Clinical Pharmacist ?Therapist, music at [facility] ?[phone] ? ?

## 2021-08-01 ENCOUNTER — Other Ambulatory Visit (INDEPENDENT_AMBULATORY_CARE_PROVIDER_SITE_OTHER): Payer: 59

## 2021-08-01 ENCOUNTER — Other Ambulatory Visit: Payer: Self-pay

## 2021-08-01 DIAGNOSIS — I10 Essential (primary) hypertension: Secondary | ICD-10-CM

## 2021-08-01 DIAGNOSIS — E1165 Type 2 diabetes mellitus with hyperglycemia: Secondary | ICD-10-CM

## 2021-08-01 DIAGNOSIS — E785 Hyperlipidemia, unspecified: Secondary | ICD-10-CM

## 2021-08-01 LAB — BASIC METABOLIC PANEL
BUN: 15 mg/dL (ref 6–23)
CO2: 31 mEq/L (ref 19–32)
Calcium: 10 mg/dL (ref 8.4–10.5)
Chloride: 101 mEq/L (ref 96–112)
Creatinine, Ser: 0.95 mg/dL (ref 0.40–1.50)
GFR: 92.91 mL/min (ref >60.00)
Glucose, Bld: 135 mg/dL — ABNORMAL HIGH (ref 70–99)
Potassium: 3.9 mEq/L (ref 3.5–5.1)
Sodium: 141 mEq/L (ref 135–145)

## 2021-08-08 ENCOUNTER — Ambulatory Visit: Payer: 59

## 2021-09-19 ENCOUNTER — Telehealth: Payer: Self-pay | Admitting: Family Medicine

## 2021-09-19 NOTE — Telephone Encounter (Signed)
Pt called in stating his blood sugar is over 300 when he eats and then it comes down after he eats 150-175. Pt does not know if he need to change medications or not. Sent to access nurse ?

## 2021-10-10 ENCOUNTER — Ambulatory Visit: Payer: 59 | Admitting: Family Medicine

## 2021-10-10 ENCOUNTER — Encounter: Payer: Self-pay | Admitting: Family Medicine

## 2021-10-10 VITALS — BP 140/70 | HR 82 | Temp 98.5°F | Ht 74.5 in | Wt 225.0 lb

## 2021-10-10 DIAGNOSIS — E1165 Type 2 diabetes mellitus with hyperglycemia: Secondary | ICD-10-CM | POA: Diagnosis not present

## 2021-10-10 DIAGNOSIS — E785 Hyperlipidemia, unspecified: Secondary | ICD-10-CM | POA: Diagnosis not present

## 2021-10-10 DIAGNOSIS — I10 Essential (primary) hypertension: Secondary | ICD-10-CM

## 2021-10-10 DIAGNOSIS — Z125 Encounter for screening for malignant neoplasm of prostate: Secondary | ICD-10-CM | POA: Diagnosis not present

## 2021-10-10 LAB — COMPREHENSIVE METABOLIC PANEL WITH GFR
ALT: 8 U/L (ref 0–53)
AST: 10 U/L (ref 0–37)
Albumin: 4.2 g/dL (ref 3.5–5.2)
Alkaline Phosphatase: 63 U/L (ref 39–117)
BUN: 16 mg/dL (ref 6–23)
CO2: 29 meq/L (ref 19–32)
Calcium: 9.7 mg/dL (ref 8.4–10.5)
Chloride: 102 meq/L (ref 96–112)
Creatinine, Ser: 0.93 mg/dL (ref 0.40–1.50)
GFR: 95.18 mL/min
Glucose, Bld: 171 mg/dL — ABNORMAL HIGH (ref 70–99)
Potassium: 3.9 meq/L (ref 3.5–5.1)
Sodium: 140 meq/L (ref 135–145)
Total Bilirubin: 0.3 mg/dL (ref 0.2–1.2)
Total Protein: 6.8 g/dL (ref 6.0–8.3)

## 2021-10-10 LAB — LIPID PANEL
Cholesterol: 190 mg/dL (ref 0–200)
HDL: 44.5 mg/dL
LDL Cholesterol: 129 mg/dL — ABNORMAL HIGH (ref 0–99)
NonHDL: 145.45
Total CHOL/HDL Ratio: 4
Triglycerides: 83 mg/dL (ref 0.0–149.0)
VLDL: 16.6 mg/dL (ref 0.0–40.0)

## 2021-10-10 LAB — MICROALBUMIN / CREATININE URINE RATIO
Creatinine,U: 175 mg/dL
Microalb Creat Ratio: 45.8 mg/g — ABNORMAL HIGH (ref 0.0–30.0)
Microalb, Ur: 80.2 mg/dL — ABNORMAL HIGH (ref 0.0–1.9)

## 2021-10-10 LAB — PSA: PSA: 1.21 ng/mL (ref 0.10–4.00)

## 2021-10-10 LAB — HEMOGLOBIN A1C: Hgb A1c MFr Bld: 8.4 % — ABNORMAL HIGH (ref 4.6–6.5)

## 2021-10-10 MED ORDER — TRULICITY 3 MG/0.5ML ~~LOC~~ SOAJ
3.0000 mg | SUBCUTANEOUS | 1 refills | Status: DC
Start: 2021-10-10 — End: 2022-02-13

## 2021-10-10 NOTE — Assessment & Plan Note (Signed)
Discussed the benefit of starting on a statin for risk reduction for cardiovascular disease.  The patient declines any medication for his cholesterol.

## 2021-10-10 NOTE — Patient Instructions (Signed)
Nice to see you. We are going to increase your Trulicity to 3 mg once weekly.  Please monitor for nausea or abdominal pain with this. We will get lab work today and contact you with the results. Please check your blood pressure on a daily basis.  Your goal blood pressure is less than 130/80.  If you are running above that please let me know.

## 2021-10-10 NOTE — Progress Notes (Signed)
[name], MD Phone: [phone]  Phillip Norman is a 51 y.o. male who presents today for f/u.  HYPERTENSION Disease Monitoring Home BP Monitoring not checking Chest pain- no    Dyspnea- no Medications Compliance-  taking amlodipine, valsartan.   Edema- no BMET    Component Value Date/Time   NA 141 08/01/2021 1410   NA 140 08/27/2015 0935   K 3.9 08/01/2021 1410   CL 101 08/01/2021 1410   CO2 31 08/01/2021 1410   GLUCOSE 135 (H) 08/01/2021 1410   GLUCOSE 115 (H) 04/19/2006 1403   BUN 15 08/01/2021 1410   BUN 10 08/27/2015 0935   CREATININE 0.95 08/01/2021 1410   CREATININE 0.88 06/04/2017 1620   CALCIUM 10.0 08/01/2021 1410   GFRNONAA >60 09/26/2019 0849   GFRAA >60 09/26/2019 0849   DIABETES Disease Monitoring: Blood Sugar ranges-135-150 fasting, 150 2 hour postprandial, excursion to 300 right after eating.  Review of CGM reveals target range glucose 43% of the time.  He is high 46% of the time and very high 11% of the time.  His GMI is 7.9%.  His average glucose is 193.  CGM is active 98% of the time over the last 14 days. Polyuria/phagia/dipsia- no      Optho- UTD Medications: Compliance- taking metformin, trulicity Hypoglycemic symptoms- no  Declines any cholesterol medication   Social History   Tobacco Use  Smoking Status Never  Smokeless Tobacco Never    Current Outpatient Medications on File Prior to Visit  Medication Sig Dispense Refill   amLODipine (NORVASC) 5 MG tablet Take 1 tablet (5 mg total) by mouth daily. 90 tablet 1   Continuous Blood Gluc Sensor (FREESTYLE LIBRE 3 SENSOR) MISC 1 each by Does not apply route daily at 2 PM. Place 1 sensor on the skin every 14 days. Use to check glucose continuously 2 each 11   fluticasone (FLONASE) 50 MCG/ACT nasal spray Place 2 sprays into both nostrils daily. 16 g 6   metFORMIN (GLUCOPHAGE-XR) 500 MG 24 hr tablet TAKE 4 TABLETS BY MOUTH DAILY WITH BREAKFAST. 360 tablet 1   sildenafil (VIAGRA) 50 MG tablet  TAKE 1 TABLET BY MOUTH DAILY AS NEEDED FOR ERECTILE DYSFUNCTION *INSUR MAX 24 PER 90 DAYS 14 tablet 2   valsartan (DIOVAN) 160 MG tablet Take 1 tablet (160 mg total) by mouth daily. 90 tablet 3   No current facility-administered medications on file prior to visit.     ROS see history of present illness  Objective  Physical Exam Vitals:   10/10/21 1048  BP: 140/70  Pulse: 82  Temp: 98.5 F (36.9 C)  SpO2: 96%    BP Readings from Last 3 Encounters:  10/10/21 140/70  04/21/21 118/72  08/16/20 140/80   Wt Readings from Last 3 Encounters:  10/10/21 225 lb (102.1 kg)  05/20/21 224 lb (101.6 kg)  04/21/21 229 lb (103.9 kg)    Physical Exam Constitutional:      General: He is not in acute distress.    Appearance: He is not diaphoretic.  Cardiovascular:     Rate and Rhythm: Normal rate and regular rhythm.     Heart sounds: Normal heart sounds.  Pulmonary:     Effort: Pulmonary effort is normal.     Breath sounds: Normal breath sounds.  Skin:    General: Skin is warm and dry.  Neurological:     Mental Status: He is alert.     Assessment/Plan: Please see individual problem list.  Problem List Items  Addressed This Visit     Essential hypertension - Primary (Chronic)    A little above goal though had been well controlled previously. I have asked him to start checking his BP and if it is >130/80 at home he will let us know. We will follow-up in 3 months.  He will continue amlodipine 5 mg once daily and valsartan 160 mg daily.       Relevant Orders   Urine Microalbumin w/creat. ratio   Hyperlipidemia LDL goal <70 (Chronic)    Discussed the benefit of starting on a statin for risk reduction for cardiovascular disease.  The patient declines any medication for his cholesterol.       Relevant Orders   Lipid panel   Comp Met (CMET)   Uncontrolled type 2 diabetes mellitus with hyperglycemia (HCC) (Chronic)    Check A1c.  Uncontrolled on CGM.  We will increase his  Trulicity to 3 mg once weekly. He will monitor for nausea and abdominal pain with this dose increase.  He will also continue metformin XR 2000 mg daily.       Relevant Medications   Dulaglutide (TRULICITY) 3 YW/7.3XT SOPN   Other Relevant Orders   Urine Microalbumin w/creat. ratio   HgB A1c   Other Visit Diagnoses     Type 2 diabetes mellitus with hyperglycemia, without long-term current use of insulin (HCC)       Relevant Medications   Dulaglutide (TRULICITY) 3 GG/2.6RS SOPN   Prostate cancer screening       Relevant Orders   PSA        Health Maintenance: We are requesting his ophthalmology records.  Return in about 3 months (around 01/10/2022).   [name], MD [place]

## 2021-10-10 NOTE — Assessment & Plan Note (Signed)
A little above goal though had been well controlled previously. I have asked him to start checking his BP and if it is >130/80 at home he will let us know. We will follow-up in 3 months.  He will continue amlodipine 5 mg once daily and valsartan 160 mg daily.

## 2021-10-10 NOTE — Assessment & Plan Note (Addendum)
Check A1c.  Uncontrolled on CGM.  We will increase his Trulicity to 3 mg once weekly. He will monitor for nausea and abdominal pain with this dose increase.  He will also continue metformin XR 2000 mg daily.

## 2021-10-11 ENCOUNTER — Other Ambulatory Visit: Payer: Self-pay | Admitting: Family Medicine

## 2021-10-11 DIAGNOSIS — I10 Essential (primary) hypertension: Secondary | ICD-10-CM

## 2021-10-13 NOTE — Telephone Encounter (Signed)
Can you confirm how much the valsartan is costing the patient? The prescription they sent back just says $0. Can you also see if there is something other than losartan that is cheaper than the valsartan?

## 2021-10-15 NOTE — Telephone Encounter (Signed)
I sent the losartan in for him. He was on this previously. He will need a BMET 7-10 days after restarting this. Order placed.

## 2021-10-17 ENCOUNTER — Other Ambulatory Visit: Payer: Self-pay | Admitting: Family Medicine

## 2021-10-17 DIAGNOSIS — R809 Proteinuria, unspecified: Secondary | ICD-10-CM

## 2021-10-28 ENCOUNTER — Other Ambulatory Visit: Payer: Self-pay | Admitting: Family Medicine

## 2021-10-28 DIAGNOSIS — E1165 Type 2 diabetes mellitus with hyperglycemia: Secondary | ICD-10-CM

## 2021-11-05 ENCOUNTER — Other Ambulatory Visit (INDEPENDENT_AMBULATORY_CARE_PROVIDER_SITE_OTHER): Payer: 59

## 2021-11-05 DIAGNOSIS — I10 Essential (primary) hypertension: Secondary | ICD-10-CM | POA: Diagnosis not present

## 2021-11-05 LAB — BASIC METABOLIC PANEL
BUN: 13 mg/dL (ref 6–23)
CO2: 29 mEq/L (ref 19–32)
Calcium: 9.7 mg/dL (ref 8.4–10.5)
Chloride: 101 mEq/L (ref 96–112)
Creatinine, Ser: 0.87 mg/dL (ref 0.40–1.50)
GFR: 99.97 mL/min (ref >60.00)
Glucose, Bld: 225 mg/dL — ABNORMAL HIGH (ref 70–99)
Potassium: 3.9 mEq/L (ref 3.5–5.1)
Sodium: 139 mEq/L (ref 135–145)

## 2021-12-01 ENCOUNTER — Other Ambulatory Visit: Payer: Self-pay | Admitting: Family Medicine

## 2021-12-02 ENCOUNTER — Telehealth: Payer: Self-pay

## 2021-12-02 ENCOUNTER — Other Ambulatory Visit: Payer: Self-pay | Admitting: Family Medicine

## 2021-12-02 ENCOUNTER — Other Ambulatory Visit: Payer: Self-pay

## 2021-12-02 DIAGNOSIS — I10 Essential (primary) hypertension: Secondary | ICD-10-CM

## 2021-12-02 MED ORDER — AMLODIPINE BESYLATE 5 MG PO TABS: 5.0000 mg | ORAL_TABLET | Freq: Every day | ORAL | 1 refills | Status: DC

## 2021-12-02 NOTE — Telephone Encounter (Signed)
Patient called to check on his refill for amLODipine (NORVASC) 5 MG tablet.  I spoke with Fulton Mole, CMA, and she said she will send it to his pharmacy right now.  I relayed Nina's message to patient.  *[facility] [address]

## 2021-12-02 NOTE — Telephone Encounter (Signed)
I sent this medication to the patients pharmacy.  Estephanie Hubbs,cma

## 2021-12-24 ENCOUNTER — Other Ambulatory Visit: Payer: Self-pay | Admitting: Family Medicine

## 2021-12-24 DIAGNOSIS — I10 Essential (primary) hypertension: Secondary | ICD-10-CM

## 2021-12-24 NOTE — Telephone Encounter (Signed)
I refilled his losartan.  He is in need of a follow-up visit in the next couple of months.  Please get him scheduled.  Thanks.

## 2022-02-12 ENCOUNTER — Telehealth: Payer: Self-pay

## 2022-02-12 ENCOUNTER — Other Ambulatory Visit: Payer: Self-pay | Admitting: Family Medicine

## 2022-02-12 DIAGNOSIS — E1165 Type 2 diabetes mellitus with hyperglycemia: Secondary | ICD-10-CM

## 2022-02-12 NOTE — Telephone Encounter (Signed)
PA has been started via covermymeds on 11/11/99 for pt's Trulicity 1.'5MG'$ /0.5ML pen-injectors  Key: [id number] PA Case [id number]  Awaiting denial or approval.

## 2022-02-12 NOTE — Telephone Encounter (Signed)
PA has been started via covermymeds on 06/10/33 for pt's Trulicity 1.'5MG'$ /0.5ML pen-injectors  Key: [id number] PA Case [id number]  Awaiting denial or approval.

## 2022-02-13 ENCOUNTER — Other Ambulatory Visit: Payer: Self-pay

## 2022-02-13 DIAGNOSIS — E1165 Type 2 diabetes mellitus with hyperglycemia: Secondary | ICD-10-CM

## 2022-02-13 MED ORDER — TRULICITY 3 MG/0.5ML ~~LOC~~ SOAJ: 3.0000 mg | SUBCUTANEOUS | 1 refills | Status: DC

## 2022-04-20 ENCOUNTER — Telehealth: Payer: Self-pay

## 2022-04-20 NOTE — Telephone Encounter (Signed)
Phillip Norman called from [employer] with [employer] to state she is sending Dr. [name] a form and the fax number is not working.  I gave her the correct fax number.

## 2022-04-21 NOTE — Telephone Encounter (Signed)
Noted  

## 2022-05-02 ENCOUNTER — Other Ambulatory Visit: Payer: Self-pay | Admitting: Family Medicine

## 2022-05-02 DIAGNOSIS — E1165 Type 2 diabetes mellitus with hyperglycemia: Secondary | ICD-10-CM

## 2022-05-12 ENCOUNTER — Telehealth (INDEPENDENT_AMBULATORY_CARE_PROVIDER_SITE_OTHER): Payer: 59 | Admitting: Family Medicine

## 2022-05-12 VITALS — Ht 75.0 in | Wt 219.0 lb

## 2022-05-12 DIAGNOSIS — R6889 Other general symptoms and signs: Secondary | ICD-10-CM

## 2022-05-12 MED ORDER — BENZONATATE 100 MG PO CAPS: ORAL_CAPSULE | ORAL | 0 refills | Status: DC

## 2022-05-12 MED ORDER — OSELTAMIVIR PHOSPHATE 75 MG PO CAPS: 75.0000 mg | ORAL_CAPSULE | Freq: Two times a day (BID) | ORAL | 0 refills | Status: DC

## 2022-05-12 NOTE — Progress Notes (Signed)
Virtual Visit via Video Note  I connected with Phillip Norman  on 05/12/22 at 12:00 PM EST by a video enabled telemedicine application and verified that I am speaking with the correct person using two identifiers.  Location patient: [place] Location provider:work or home office Persons participating in the virtual visit: patient, provider  I discussed the limitations and requested verbal permission for telemedicine visit. The patient expressed understanding and agreed to proceed.   HPI:  Acute telemedicine visit for upper resp symptoms: -Onset: 1-2 days ago -daughter has the flu - tested positive, wife has the flu too -Symptoms include:nasal congestion, cough, body aches, sinus headaches, feels tired -did a covid test yesterday which was negative -Denies:CP, SOB, NVD, inability to tol oral intake of fluids -Has tried:nyquil, advil -Pertinent past medical history: see below -Pertinent medication allergies:No Known Allergies -COVID-19 vaccine status:  Immunization History  Administered Date(s) Administered   Influenza Whole 05/15/2009, 02/24/2010   Influenza, Seasonal, Injecte, Preservative Fre 06/02/2012   Influenza,inj,Quad PF,6+ Mos 02/08/2014, 04/24/2015, 02/26/2017, 02/08/2018   Pneumococcal Conjugate-13 05/14/2014   Pneumococcal Polysaccharide-23 06/20/2009   Tdap 02/08/2014     ROS: See pertinent positives and negatives per HPI.  Past Medical History:  Diagnosis Date   Chronic back pain    Diabetes mellitus without complication (HCC)    GERD (gastroesophageal reflux disease)    Hyperlipidemia    Hypertension     Past Surgical History:  Procedure Laterality Date   COLONOSCOPY WITH PROPOFOL N/A 11/10/2019   Procedure: COLONOSCOPY WITH PROPOFOL;  Surgeon: Jonathon Bellows, MD;  Location: [facility] END[facility];  Service: Gastroenterology;  Laterality: N/A;   TONSILLECTOMY AND ADENOIDECTOMY       Current Outpatient Medications:    amLODipine (NORVASC) 5 MG tablet, Take 1 tablet (5 mg total)  by mouth daily., Disp: 90 tablet, Rfl: 1   benzonatate (TESSALON PERLES) 100 MG capsule, 1-2 capsules up to twice daily as needed for cough., Disp: 30 capsule, Rfl: 0   Dulaglutide (TRULICITY) 3 WE/3.1VQ SOPN, Inject 3 mg as directed once a week., Disp: 6 mL, Rfl: 1   losartan (COZAAR) 100 MG tablet, TAKE 1 TABLET BY MOUTH EVERY DAY, Disp: 90 tablet, Rfl: 1   metFORMIN (GLUCOPHAGE-XR) 500 MG 24 hr tablet, TAKE 4 TABLETS BY MOUTH DAILY WITH BREAKFAST., Disp: 360 tablet, Rfl: 1   oseltamivir (TAMIFLU) 75 MG capsule, Take 1 capsule (75 mg total) by mouth 2 (two) times daily., Disp: 10 capsule, Rfl: 0   sildenafil (VIAGRA) 50 MG tablet, TAKE 1 TABLET BY MOUTH DAILY AS NEEDED FOR ERECTILE DYSFUNCTION *INSUR MAX 24 PER 90 DAYS, Disp: 14 tablet, Rfl: 2   Continuous Blood Gluc Sensor (FREESTYLE LIBRE 3 SENSOR) MISC, 1 each by Does not apply route daily at 2 PM. Place 1 sensor on the skin every 14 days. Use to check glucose continuously (Patient not taking: Reported on 05/12/2022), Disp: 2 each, Rfl: 11   fluticasone (FLONASE) 50 MCG/ACT nasal spray, Place 2 sprays into both nostrils daily. (Patient not taking: Reported on 05/12/2022), Disp: 16 g, Rfl: 6  EXAM:  VITALS per patient if applicable: Today's Vitals   05/12/22 1132  Weight: 219 lb (99.3 kg)  Height: '6\' 3"'$  (1.905 m)   Body mass index is 27.37 kg/m.   GENERAL: alert, oriented, appears well and in no acute distress  HEENT: atraumatic, conjunttiva clear, no obvious abnormalities on inspection of external nose and ears  NECK: normal movements of the head and neck  LUNGS: on inspection no signs of respiratory  distress, breathing rate appears normal, no obvious gross SOB, gasping or wheezing  CV: no obvious cyanosis  MS: moves all visible extremities without noticeable abnormality  PSYCH/NEURO: pleasant and cooperative, no obvious depression or anxiety, speech and thought processing grossly intact  ASSESSMENT AND PLAN:  Discussed the  following assessment and plan:  Flu-like symptoms  -we discussed possible serious and likely etiologies, options for evaluation and workup, limitations of telemedicine visit vs in person visit, treatment, treatment risks and precautions. Pt is agreeable to treatment via telemedicine at this moment.  Work/School slipped offered:  declined Advised to seek prompt virtual visit or in person care if worsening, new symptoms arise, or if is not improving with treatment as expected per our conversation of expected course. Discussed options for follow up care. Did let this patient know that I do telemedicine on Tuesdays and Thursdays for [place] and those are the days I am logged into the system. Advised to schedule follow up visit with PCP, [facility] virtual visits or [facility] if any further questions or concerns to avoid delays in care.   I discussed the assessment and treatment plan with the patient. The patient was provided an opportunity to ask questions and all were answered. The patient agreed with the plan and demonstrated an understanding of the instructions.     Lucretia Kern, DO

## 2022-05-12 NOTE — Patient Instructions (Signed)
-  I sent the medication(s) we discussed to your pharmacy: Meds ordered this encounter  Medications   oseltamivir (TAMIFLU) 75 MG capsule    Sig: Take 1 capsule (75 mg total) by mouth 2 (two) times daily.    Dispense:  10 capsule    Refill:  0   benzonatate (TESSALON PERLES) 100 MG capsule    Sig: 1-2 capsules up to twice daily as needed for cough.    Dispense:  30 capsule    Refill:  0     I hope you are feeling better soon!  Seek in person care promptly if your symptoms worsen, new concerns arise or you are not improving with treatment.  It was nice to meet you today. I help  out with telemedicine visits on Tuesdays and Thursdays and am happy to help if you need a virtual follow up visit on those days. Otherwise, if you have any concerns or questions following this visit please schedule a follow up visit with your Primary Care office or seek care at a local urgent care clinic to avoid delays in care. If you are having severe or life threatening symptoms please call 911 and/or go to the nearest emergency room.

## 2022-05-29 ENCOUNTER — Other Ambulatory Visit: Payer: Self-pay | Admitting: Family Medicine

## 2022-05-29 DIAGNOSIS — I10 Essential (primary) hypertension: Secondary | ICD-10-CM

## 2022-06-07 ENCOUNTER — Other Ambulatory Visit: Payer: Self-pay | Admitting: Family Medicine

## 2022-06-07 DIAGNOSIS — E1165 Type 2 diabetes mellitus with hyperglycemia: Secondary | ICD-10-CM

## 2022-06-23 ENCOUNTER — Other Ambulatory Visit: Payer: Self-pay | Admitting: Family Medicine

## 2022-06-23 DIAGNOSIS — I10 Essential (primary) hypertension: Secondary | ICD-10-CM

## 2022-06-29 ENCOUNTER — Telehealth: Payer: Self-pay

## 2022-06-29 ENCOUNTER — Other Ambulatory Visit (HOSPITAL_COMMUNITY): Payer: Self-pay

## 2022-06-29 NOTE — Telephone Encounter (Signed)
Pharmacy Patient Advocate Encounter   Received notification from [facility] that prior authorization for Trulicity '3mg'$ /0.63m is required/requested.  Per Test Claim: Prior authorization is required   PA submitted on 06/29/22 to (ins) [employer] via CHagueStatus is pending

## 2022-07-02 NOTE — Telephone Encounter (Signed)
Pharmacy Patient Advocate Encounter  Prior Authorization for TRULICITY '3MG'$ /0.5ML PEN has been approved.     Effective dates: 06/29/2022 through 06/30/2023  [facility] Patient Advocate

## 2022-07-24 ENCOUNTER — Other Ambulatory Visit: Payer: Self-pay | Admitting: Family Medicine

## 2022-07-24 DIAGNOSIS — I10 Essential (primary) hypertension: Secondary | ICD-10-CM

## 2022-08-25 ENCOUNTER — Other Ambulatory Visit: Payer: Self-pay | Admitting: Family Medicine

## 2022-08-25 DIAGNOSIS — I10 Essential (primary) hypertension: Secondary | ICD-10-CM

## 2022-08-28 ENCOUNTER — Other Ambulatory Visit: Payer: Self-pay | Admitting: Family Medicine

## 2022-08-28 DIAGNOSIS — I10 Essential (primary) hypertension: Secondary | ICD-10-CM

## 2022-09-25 ENCOUNTER — Other Ambulatory Visit: Payer: Self-pay | Admitting: Family Medicine

## 2022-09-25 DIAGNOSIS — I10 Essential (primary) hypertension: Secondary | ICD-10-CM

## 2022-10-28 ENCOUNTER — Other Ambulatory Visit: Payer: Self-pay | Admitting: Family

## 2022-10-28 DIAGNOSIS — E1165 Type 2 diabetes mellitus with hyperglycemia: Secondary | ICD-10-CM

## 2022-11-11 LAB — LIPID PANEL
Cholesterol: 213 — AB (ref 0–200)
HDL: 62 (ref 35–70)
LDL Cholesterol: 130
LDl/HDL Ratio: 3.4
Triglycerides: 103 (ref 40–160)

## 2022-11-11 LAB — BASIC METABOLIC PANEL
BUN: 10 (ref 4–21)
CO2: 29 — AB (ref 13–22)
Chloride: 98 — AB (ref 99–108)
Creatinine: 0.9 (ref 0.6–1.3)
Glucose: 440
Potassium: 4.4 mEq/L (ref 3.5–5.1)
Sodium: 135 — AB (ref 137–147)

## 2022-11-11 LAB — CBC AND DIFFERENTIAL
HCT: 38 — AB (ref 41–53)
Hemoglobin: 12.1 — AB (ref 13.5–17.5)
WBC: 5.3

## 2022-11-11 LAB — COMPREHENSIVE METABOLIC PANEL
Albumin: 4.4 (ref 3.5–5.0)
Calcium: 10 (ref 8.7–10.7)
Globulin: 2.7
eGFR: 101

## 2022-11-11 LAB — HEPATIC FUNCTION PANEL
ALT: 28 U/L (ref 10–40)
AST: 24 (ref 14–40)
Alkaline Phosphatase: 78 (ref 25–125)
Bilirubin, Total: 0.4

## 2022-11-11 LAB — CBC: RBC: 4.46 (ref 3.87–5.11)

## 2022-11-11 LAB — HEMOGLOBIN A1C: Hemoglobin A1C: 9.9

## 2022-11-12 ENCOUNTER — Telehealth: Payer: Self-pay | Admitting: Family Medicine

## 2022-11-12 ENCOUNTER — Other Ambulatory Visit: Payer: Self-pay

## 2022-11-12 DIAGNOSIS — E1165 Type 2 diabetes mellitus with hyperglycemia: Secondary | ICD-10-CM

## 2022-11-12 MED ORDER — METFORMIN HCL ER 500 MG PO TB24
ORAL_TABLET | ORAL | 0 refills | Status: DC
Start: 2022-11-12 — End: 2022-12-08

## 2022-11-12 NOTE — Telephone Encounter (Signed)
1 month refill sent to pharmacy.  Patient needs to keep his upcoming visit for further refills.

## 2022-11-12 NOTE — Telephone Encounter (Signed)
Noted  

## 2022-11-12 NOTE — Telephone Encounter (Signed)
Left message to call the office back regarding Dr. Purvis Sheffield message about his medication and appointment.

## 2022-11-12 NOTE — Telephone Encounter (Signed)
Prescription Request Patient is going out of town today. Requesting a 30 day refill today. 11/12/2022  LOV: Visit date not found  What is the name of the medication or equipment? metFORMIN (GLUCOPHAGE-XR) 500 MG 24 hr tablet   Have you contacted your pharmacy to request a refill? No   Which pharmacy would you like this sent to?  [id number] [place] [address] [place] [address] Phone: [phone] Fax: [phone]   Patient notified that their request is being sent to the clinical staff for review and that they should receive a response within 2 business days.   Please advise at Mobile [phone] (mobile)

## 2022-11-12 NOTE — Telephone Encounter (Signed)
Patient returned our call.  I read Dr. Bernardo Heater message to patient.

## 2022-12-04 ENCOUNTER — Other Ambulatory Visit: Payer: Self-pay | Admitting: Family Medicine

## 2022-12-04 DIAGNOSIS — E1165 Type 2 diabetes mellitus with hyperglycemia: Secondary | ICD-10-CM

## 2022-12-07 ENCOUNTER — Telehealth: Payer: Self-pay

## 2022-12-07 ENCOUNTER — Ambulatory Visit: Payer: 59 | Admitting: Family Medicine

## 2022-12-07 ENCOUNTER — Telehealth: Payer: Self-pay | Admitting: Family Medicine

## 2022-12-07 ENCOUNTER — Other Ambulatory Visit (HOSPITAL_COMMUNITY): Payer: Self-pay

## 2022-12-07 ENCOUNTER — Encounter: Payer: Self-pay | Admitting: Family Medicine

## 2022-12-07 VITALS — BP 128/82 | HR 78 | Temp 97.8°F | Ht 75.0 in | Wt 221.2 lb

## 2022-12-07 DIAGNOSIS — Z125 Encounter for screening for malignant neoplasm of prostate: Secondary | ICD-10-CM | POA: Diagnosis not present

## 2022-12-07 DIAGNOSIS — E1165 Type 2 diabetes mellitus with hyperglycemia: Secondary | ICD-10-CM | POA: Diagnosis not present

## 2022-12-07 DIAGNOSIS — Z7985 Long-term (current) use of injectable non-insulin antidiabetic drugs: Secondary | ICD-10-CM

## 2022-12-07 DIAGNOSIS — E785 Hyperlipidemia, unspecified: Secondary | ICD-10-CM

## 2022-12-07 DIAGNOSIS — I10 Essential (primary) hypertension: Secondary | ICD-10-CM | POA: Diagnosis not present

## 2022-12-07 LAB — PSA: PSA: 1.46 ng/mL (ref 0.10–4.00)

## 2022-12-07 MED ORDER — AMLODIPINE BESYLATE 5 MG PO TABS
5.0000 mg | ORAL_TABLET | Freq: Every day | ORAL | 1 refills | Status: DC
Start: 2022-12-07 — End: 2023-06-21

## 2022-12-07 MED ORDER — TIRZEPATIDE 2.5 MG/0.5ML ~~LOC~~ SOAJ: 2.5000 mg | SUBCUTANEOUS | 0 refills | Status: DC

## 2022-12-07 MED ORDER — TIRZEPATIDE 5 MG/0.5ML ~~LOC~~ SOAJ: 5.0000 mg | SUBCUTANEOUS | 1 refills | Status: DC

## 2022-12-07 NOTE — Telephone Encounter (Signed)
Nephrology called in Atorvastatin 10 mg and the Patient started it today. Patient is scheduled for lab work on 01/21/23.

## 2022-12-07 NOTE — Patient Instructions (Signed)
Nice to see you. We are going to start you on Mounjaro.  You will take Mounjaro 2.5 mg weekly for 4 weeks and then increase to Mounjaro 5 mg weekly.  If you get through 4 weeks of 5 mg of Mounjaro and your sugars are still greater than 130 fasting please let me know so we can increase the dose again.

## 2022-12-07 NOTE — Telephone Encounter (Signed)
*  Primary  PA request received for Mounjaro 2.5MG /0.5ML pen-injectors  PA submitted to [employer] via CMM and has been APPROVED from 12/07/2022-12/06/2025  Key: Chandra Batch

## 2022-12-07 NOTE — Assessment & Plan Note (Addendum)
Chronic issue.  Generally at goal at home.  For now patient will continue to monitor.  He will continue amlodipine 5 mg daily and losartan 100 mg daily.

## 2022-12-07 NOTE — Telephone Encounter (Signed)
 Noted  

## 2022-12-07 NOTE — Progress Notes (Signed)
Marikay Alar, MD Phone: [phone]  Phillip Norman is a 52 y.o. male who presents today for f/u.  HYPERTENSION Disease Monitoring Home BP Monitoring 125-130/77-80 Chest pain- no    Dyspnea- no Medications Compliance-  taking amlodipine, losartan.  Edema- no BMET    Component Value Date/Time   NA 139 11/05/2021 0940   NA 140 08/27/2015 0935   K 3.9 11/05/2021 0940   CL 101 11/05/2021 0940   CO2 29 11/05/2021 0940   GLUCOSE 225 (H) 11/05/2021 0940   GLUCOSE 115 (H) 04/19/2006 1403   BUN 13 11/05/2021 0940   BUN 10 08/27/2015 0935   CREATININE 0.87 11/05/2021 0940   CREATININE 0.88 06/04/2017 1620   CALCIUM 9.7 11/05/2021 0940   GFRNONAA >60 09/26/2019 0849   GFRAA >60 09/26/2019 0849   DIABETES Disease Monitoring: Blood Sugar ranges-170 is the lowest, recent A1c 9.9 Polyuria/phagia/dipsia- no      Optho- UTD Medications: Compliance- taking metformin, has been out of trulicity about 2 months Hypoglycemic symptoms- no Has been going to the gym 3 times a week for 1-1:20 hours.  Notes he has replaced one of his meals with a protein shake.  He does eat a lot of chips due to stress eating.  Patient notes a lot of stress related to his job.  He is looking to move back to [place] to be closer to his family and get a different job.   Social History   Tobacco Use  Smoking Status Never  Smokeless Tobacco Never    Current Outpatient Medications on File Prior to Visit  Medication Sig Dispense Refill   benzonatate (TESSALON PERLES) 100 MG capsule 1-2 capsules up to twice daily as needed for cough. 30 capsule 0   Continuous Blood Gluc Sensor (FREESTYLE LIBRE 3 SENSOR) MISC 1 EACH BY DOES NOT APPLY ROUTE DAILY AT 2 PM. PLACE 1 SENSOR ON THE SKIN EVERY 14 DAYS. USE TO CHECK GLUCOSE CONTINUOUSLY 2 each 11   fluticasone (FLONASE) 50 MCG/ACT nasal spray Place 2 sprays into both nostrils daily. 16 g 6   losartan (COZAAR) 100 MG tablet TAKE 1 TABLET BY MOUTH EVERY DAY 90 tablet 1    metFORMIN (GLUCOPHAGE-XR) 500 MG 24 hr tablet TAKE 4 TABLETS BY MOUTH DAILY WITH BREAKFAST. 120 tablet 0   sildenafil (VIAGRA) 50 MG tablet TAKE 1 TABLET BY MOUTH DAILY AS NEEDED FOR ERECTILE DYSFUNCTION *INSUR MAX 24 PER 90 DAYS 14 tablet 2   No current facility-administered medications on file prior to visit.     ROS see history of present illness  Objective  Physical Exam Vitals:   12/07/22 1120 12/07/22 1213  BP: 134/80 128/82  Pulse: 78   Temp: 97.8 F (36.6 C)   SpO2: 99%     BP Readings from Last 3 Encounters:  12/07/22 128/82  10/10/21 140/70  04/21/21 118/72   Wt Readings from Last 3 Encounters:  12/07/22 221 lb 3.2 oz (100.3 kg)  05/12/22 219 lb (99.3 kg)  10/10/21 225 lb (102.1 kg)    Physical Exam Constitutional:      General: He is not in acute distress.    Appearance: He is not diaphoretic.  Cardiovascular:     Rate and Rhythm: Normal rate and regular rhythm.     Heart sounds: Normal heart sounds.  Pulmonary:     Effort: Pulmonary effort is normal.     Breath sounds: Normal breath sounds.  Skin:    General: Skin is warm and dry.  Neurological:  Mental Status: He is alert.    Diabetic Foot Exam - Simple   Simple Foot Form Diabetic Foot exam was performed with the following findings: Yes 12/07/2022  1:31 PM  Visual Inspection No deformities, no ulcerations, no other skin breakdown bilaterally: Yes Sensation Testing Intact to touch and monofilament testing bilaterally: Yes Pulse Check Posterior Tibialis and Dorsalis pulse intact bilaterally: Yes Comments      Assessment/Plan: Please see individual problem list.  Essential hypertension Assessment & Plan: Chronic issue.  Generally at goal at home.  For now patient will continue to monitor.  He will continue amlodipine 5 mg daily and losartan 100 mg daily.  Orders: -     amLODIPine Besylate; Take 1 tablet (5 mg total) by mouth daily.  Dispense: 90 tablet; Refill: 1  Hyperlipidemia LDL  goal <70 Assessment & Plan: Chronic issue.  Discussed the importance of statin therapy for patients with diabetes.  Discussed getting his LDL down to less than 70.  Patient notes his nephrologist sent a statin for him to start on and he is going to start on this.  Patient will start the statin and he will return in 6 weeks for lab work.   Uncontrolled type 2 diabetes mellitus with hyperglycemia (HCC) Assessment & Plan: Uncontrolled.  Patient will continue metformin XR 2000 mg daily.  We will have him start Mounjaro 2.5 mg weekly for 4 weeks and then increase to 5 mg weekly.  If his sugars are not coming down to fasting levels less than 130 on the 5 mg dose after a month he will let us know and we can increase the dose further.  If this is not affordable he will let us know.  Orders: -     Tirzepatide; Inject 2.5 mg into the skin once a week.  Dispense: 2 mL; Refill: 0 -     Tirzepatide; Inject 5 mg into the skin once a week. Start after completing the 2.5 mg dose  Dispense: 6 mL; Refill: 1  Prostate cancer screening -     PSA    Return in about 3 months (around 03/09/2023).   Marikay Alar, MD [facility] Primary Care [facility]

## 2022-12-07 NOTE — Assessment & Plan Note (Signed)
Uncontrolled.  Patient will continue metformin XR 2000 mg daily.  We will have him start Mounjaro 2.5 mg weekly for 4 weeks and then increase to 5 mg weekly.  If his sugars are not coming down to fasting levels less than 130 on the 5 mg dose after a month he will let us know and we can increase the dose further.  If this is not affordable he will let us know.

## 2022-12-07 NOTE — Telephone Encounter (Signed)
I forgot to have the patient's schedule a 6-week lab follow-up after starting on a statin.  Please find out which statin his nephrologist sent in for him.  Please get him scheduled for 6-week lab follow-up.  Labs ordered.

## 2022-12-07 NOTE — Assessment & Plan Note (Signed)
Chronic issue.  Discussed the importance of statin therapy for patients with diabetes.  Discussed getting his LDL down to less than 70.  Patient notes his nephrologist sent a statin for him to start on and he is going to start on this.  Patient will start the statin and he will return in 6 weeks for lab work.

## 2022-12-07 NOTE — Addendum Note (Signed)
Addended by: Glori Luis on: 12/07/2022 04:26 PM   Modules accepted: Orders

## 2022-12-10 NOTE — Progress Notes (Signed)
Labs have been abstracted from Nephrology.

## 2023-01-04 ENCOUNTER — Other Ambulatory Visit: Payer: Self-pay | Admitting: Family Medicine

## 2023-01-04 DIAGNOSIS — E1165 Type 2 diabetes mellitus with hyperglycemia: Secondary | ICD-10-CM

## 2023-01-21 ENCOUNTER — Other Ambulatory Visit (INDEPENDENT_AMBULATORY_CARE_PROVIDER_SITE_OTHER): Payer: 59

## 2023-01-21 ENCOUNTER — Other Ambulatory Visit: Payer: 59

## 2023-01-21 DIAGNOSIS — E785 Hyperlipidemia, unspecified: Secondary | ICD-10-CM | POA: Diagnosis not present

## 2023-01-21 LAB — HEPATIC FUNCTION PANEL
ALT: 13 U/L (ref 0–53)
AST: 12 U/L (ref 0–37)
Albumin: 4 g/dL (ref 3.5–5.2)
Alkaline Phosphatase: 69 U/L (ref 39–117)
Bilirubin, Direct: 0.1 mg/dL (ref 0.0–0.3)
Total Bilirubin: 0.3 mg/dL (ref 0.2–1.2)
Total Protein: 6.5 g/dL (ref 6.0–8.3)

## 2023-01-21 LAB — LDL CHOLESTEROL, DIRECT: Direct LDL: 65 mg/dL

## 2023-03-04 ENCOUNTER — Other Ambulatory Visit: Payer: Self-pay | Admitting: Family Medicine

## 2023-03-04 DIAGNOSIS — I10 Essential (primary) hypertension: Secondary | ICD-10-CM

## 2023-03-09 ENCOUNTER — Ambulatory Visit: Payer: 59 | Admitting: Family Medicine

## 2023-03-09 ENCOUNTER — Encounter: Payer: Self-pay | Admitting: Family Medicine

## 2023-03-09 VITALS — BP 126/78 | HR 75 | Temp 98.1°F | Ht 75.0 in | Wt 216.4 lb

## 2023-03-09 DIAGNOSIS — E1165 Type 2 diabetes mellitus with hyperglycemia: Secondary | ICD-10-CM

## 2023-03-09 DIAGNOSIS — Z7985 Long-term (current) use of injectable non-insulin antidiabetic drugs: Secondary | ICD-10-CM

## 2023-03-09 DIAGNOSIS — I10 Essential (primary) hypertension: Secondary | ICD-10-CM | POA: Diagnosis not present

## 2023-03-09 DIAGNOSIS — M25561 Pain in right knee: Secondary | ICD-10-CM

## 2023-03-09 DIAGNOSIS — Z7984 Long term (current) use of oral hypoglycemic drugs: Secondary | ICD-10-CM

## 2023-03-09 DIAGNOSIS — G8929 Other chronic pain: Secondary | ICD-10-CM | POA: Diagnosis not present

## 2023-03-09 LAB — HEMOGLOBIN A1C: Hgb A1c MFr Bld: 8.3 % — ABNORMAL HIGH (ref 4.6–6.5)

## 2023-03-09 LAB — BASIC METABOLIC PANEL
BUN: 13 mg/dL (ref 6–23)
CO2: 29 meq/L (ref 19–32)
Calcium: 9.6 mg/dL (ref 8.4–10.5)
Chloride: 101 meq/L (ref 96–112)
Creatinine, Ser: 0.87 mg/dL (ref 0.40–1.50)
GFR: 99.04 mL/min (ref >60.00)
Glucose, Bld: 117 mg/dL — ABNORMAL HIGH (ref 70–99)
Potassium: 3.7 meq/L (ref 3.5–5.1)
Sodium: 141 meq/L (ref 135–145)

## 2023-03-09 LAB — MICROALBUMIN / CREATININE URINE RATIO
Creatinine,U: 45 mg/dL
Microalb Creat Ratio: 1.6 mg/g (ref 0.0–30.0)
Microalb, Ur: <0.7 mg/dL (ref 0.0–1.9)

## 2023-03-09 MED ORDER — LOSARTAN POTASSIUM 100 MG PO TABS
100.0000 mg | ORAL_TABLET | Freq: Every day | ORAL | 1 refills | Status: DC
Start: 2023-03-09 — End: 2023-12-16

## 2023-03-09 MED ORDER — METFORMIN HCL ER 500 MG PO TB24
ORAL_TABLET | ORAL | 1 refills | Status: DC
Start: 2023-03-09 — End: 2023-06-18

## 2023-03-09 MED ORDER — ATORVASTATIN CALCIUM 10 MG PO TABS: 10.0000 mg | ORAL_TABLET | Freq: Every day | ORAL | 1 refills | Status: DC

## 2023-03-09 MED ORDER — MOUNJARO 2.5 MG/0.5ML ~~LOC~~ SOAJ
2.5000 mg | SUBCUTANEOUS | 2 refills | Status: DC
Start: 2023-03-09 — End: 2023-12-16

## 2023-03-09 NOTE — Patient Instructions (Addendum)
Nice to see you. We will contact you with your lab results. Orthopedic should contact you to schedule a visit.  If you do not hear from them in the next 2 weeks please let us know

## 2023-03-09 NOTE — Assessment & Plan Note (Signed)
Discussed possible meniscal tear versus arthritis.  Refer to orthopedics for further management and evaluation.  Patient noted he is hesitant to get a steroid injection for this.

## 2023-03-09 NOTE — Assessment & Plan Note (Signed)
Chronic issue.  Seems to have improved some.  Patient will continue metformin XR 2000 mg daily.  He will continue Mounjaro 2.5 mg weekly.  Discussed if his A1c is not at goal then we would need to increase the Mounjaro to 5 mg weekly.  Discussed the minimal risk of hypoglycemia with that medication.

## 2023-03-09 NOTE — Assessment & Plan Note (Signed)
Chronic issue.  At goal today.  Patient will continue amlodipine 5 mg daily and losartan 100 mg daily.

## 2023-03-09 NOTE — Progress Notes (Signed)
Marikay Alar, MD Phone: [phone]  Phillip Norman is a 52 y.o. male who presents today for follow-up.  Diabetes: Typically less than 150.  He is on Mounjaro 2.5 mg weekly.  Taking metformin as well.  Notes a low sugar has gotten his 70.  He notes he is due to see ophthalmology in January.  Hypertension: Not checking blood pressures.  He is taking amlodipine and losartan.  No chest pain, shortness of breath, or edema.  Right knee pain and swelling: This has been going on for a while.  He notes no specific injury.  He notes pain when he exercises though it is not severe and does not limit his ability to exercise.  He notes on 1 occasion it popped and 1 occasion it buckled.  It does not lock up on him.  Social History   Tobacco Use  Smoking Status Never  Smokeless Tobacco Never    Current Outpatient Medications on File Prior to Visit  Medication Sig Dispense Refill   amLODipine (NORVASC) 5 MG tablet Take 1 tablet (5 mg total) by mouth daily. 90 tablet 1   benzonatate (TESSALON PERLES) 100 MG capsule 1-2 capsules up to twice daily as needed for cough. 30 capsule 0   Continuous Blood Gluc Sensor (FREESTYLE LIBRE 3 SENSOR) MISC 1 EACH BY DOES NOT APPLY ROUTE DAILY AT 2 PM. PLACE 1 SENSOR ON THE SKIN EVERY 14 DAYS. USE TO CHECK GLUCOSE CONTINUOUSLY 2 each 11   fluticasone (FLONASE) 50 MCG/ACT nasal spray Place 2 sprays into both nostrils daily. 16 g 6   sildenafil (VIAGRA) 50 MG tablet TAKE 1 TABLET BY MOUTH DAILY AS NEEDED FOR ERECTILE DYSFUNCTION *INSUR MAX 24 PER 90 DAYS 14 tablet 2   No current facility-administered medications on file prior to visit.     ROS see history of present illness  Objective  Physical Exam Vitals:   03/09/23 1012 03/09/23 1016  BP: 136/82 126/78  Pulse: 75   Temp: 98.1 F (36.7 C)   SpO2: 98%     BP Readings from Last 3 Encounters:  03/09/23 126/78  12/07/22 128/82  10/10/21 140/70   Wt Readings from Last 3 Encounters:  03/09/23 216 lb  6.4 oz (98.2 kg)  12/07/22 221 lb 3.2 oz (100.3 kg)  05/12/22 219 lb (99.3 kg)    Physical Exam Constitutional:      General: He is not in acute distress.    Appearance: He is not diaphoretic.  Cardiovascular:     Rate and Rhythm: Normal rate and regular rhythm.     Heart sounds: Normal heart sounds.  Pulmonary:     Effort: Pulmonary effort is normal.     Breath sounds: Normal breath sounds.  Musculoskeletal:     Comments: There is swelling in his right knee on exam, no tenderness of the knee, there is discomfort and popping on McMurray's testing  Skin:    General: Skin is warm and dry.  Neurological:     Mental Status: He is alert.      Assessment/Plan: Please see individual problem list.  Chronic pain of right knee Assessment & Plan: Discussed possible meniscal tear versus arthritis.  Refer to orthopedics for further management and evaluation.  Patient noted he is hesitant to get a steroid injection for this.  Orders: -     Ambulatory referral to Orthopedic Surgery  Essential hypertension Assessment & Plan: Chronic issue.  At goal today.  Patient will continue amlodipine 5 mg daily and losartan 100 mg  daily.  Orders: -     Losartan Potassium; Take 1 tablet (100 mg total) by mouth daily.  Dispense: 90 tablet; Refill: 1 -     Basic metabolic panel  Uncontrolled type 2 diabetes mellitus with hyperglycemia (HCC) Assessment & Plan: Chronic issue.  Seems to have improved some.  Patient will continue metformin XR 2000 mg daily.  He will continue Mounjaro 2.5 mg weekly.  Discussed if his A1c is not at goal then we would need to increase the Mounjaro to 5 mg weekly.  Discussed the minimal risk of hypoglycemia with that medication.  Orders: -     metFORMIN HCl ER; TAKE 4 TABLETS BY MOUTH DAILY WITH BREAKFAST.  Dispense: 360 tablet; Refill: 1 -     Mounjaro; Inject 2.5 mg into the skin once a week.  Dispense: 2 mL; Refill: 2 -     Hemoglobin A1c -     Microalbumin /  creatinine urine ratio  Other orders -     Atorvastatin Calcium; Take 1 tablet (10 mg total) by mouth daily.  Dispense: 90 tablet; Refill: 1    Return in about 3 months (around 06/09/2023) for Diabetes.   Marikay Alar, MD [facility] Primary Care [facility]

## 2023-06-11 ENCOUNTER — Ambulatory Visit: Payer: 59 | Admitting: Family Medicine

## 2023-06-16 ENCOUNTER — Ambulatory Visit: Payer: 59 | Admitting: Family Medicine

## 2023-06-16 ENCOUNTER — Telehealth: Payer: 59 | Admitting: Family Medicine

## 2023-06-16 DIAGNOSIS — E1165 Type 2 diabetes mellitus with hyperglycemia: Secondary | ICD-10-CM

## 2023-06-16 DIAGNOSIS — I10 Essential (primary) hypertension: Secondary | ICD-10-CM

## 2023-06-17 ENCOUNTER — Ambulatory Visit: Payer: Self-pay | Admitting: Family Medicine

## 2023-06-17 NOTE — Telephone Encounter (Signed)
Chief Complaint: Hypoglycemia Symptoms: 1/10 chest tightness Pertinent Negatives: Patient denies dizziness, sweating, being symptomatic Disposition: [] ED /[] Urgent Care (no appt availability in office) / [] Appointment(In office/virtual)/ []  [place] [place] [facility]/ [x] Home Care/ [] Refused Recommended Disposition /[] [facility]/ []  Follow-up with PCP Additional Notes: Patient called in stating his glucose reading was 53. Patient has a Freestyle Libre implanted sensor in his arm. Patient states this sensor was changed out yesterday and he has noticed his glucose has been running a little lower than his baseline (low 100's, baseline is typically around 150). Patient states he is also recovering from Covid so he is not eating many carbs, just soups. Patient denies any symptoms, and patient is A&O x 4. Patient's wife is with patient. Patient states he is taking Metformin and Mounjaro and states he has lost roughly 25 pounds, so he will discuss with Dr. Birdie Sons to determine if medications needed adjustment to prevent hypoglycemia. Patient already had appointment scheduled for tomorrow with Dr. Birdie Sons. Advised patient to drink juice, and continue to monitor glucose. Advised patient to be seen in ER if he becomes symptomatic with hypoglycemia or if glucose level does not rise. Patient verbalized understanding.   Copied from CRM [id number]. Topic: Clinical - Red Word Triage >> Jun 17, 2023  4:53 PM Alvino Blood C wrote: Red Word that prompted transfer to Nurse Triage: Patients blood sugar is low 53. Patient had tightness in his chest 2 days ago. Reason for Disposition  [1] Blood glucose 70 mg/dl (3.9 mmol/l) or below, OR symptomatic AND [2] cause known  Answer Assessment - Initial Assessment Questions 1. SYMPTOMS: "What symptoms are you concerned about?"     Yesterday had a little tightness in chest, on a scale of 1-10 a 1.5,  2. ONSET:  "When did the symptoms start?"     On and off over last few  days, today is lowest reading 3. BLOOD GLUCOSE: "What is your blood glucose level?"      53 4. USUAL RANGE: "What is your blood glucose level usually?" (e.g., usual fasting morning value, usual evening value)     Normally ranges somewhere between 125-275 when he eats. Rides around 125-150.  5. TYPE 1 or 2:  "Do you know what type of diabetes you have?"  (e.g., Type 1, Type 2, Gestational; doesn't know)      Type 2 and on Metformin 6. INSULIN: "Do you take insulin?" "What type of insulin(s) do you use? What is the mode of delivery? (syringe, pen; injection or pump) "When did you last give yourself an insulin dose?" (i.e., time or hours/minutes ago) "How much did you give?" (i.e., how many units)     Metformin and Mounjaro 7. DIABETES PILLS: "Do you take any pills for your diabetes?" If Yes, ask: "What is the name of the medicine(s) that you take for high blood sugar?"     Metformin and Mounjaro 8. OTHER SYMPTOMS: "Do you have any symptoms?" (e.g., fever, frequent urination, difficulty breathing, vomiting)     Recovering from Covid now 9. LOW BLOOD GLUCOSE TREATMENT: "What have you done so far to treat the low blood glucose level?"     Ate doritos, drinking gatorade now 10. FOOD: "When did you last eat or drink?"       This morning at 1030 - but just ate doritos (current time 1700) 11. ALONE: "Are you alone right now or is someone with you?"        Wife is present with patient  Protocols  used: Diabetes - Low Blood Sugar-A-AH

## 2023-06-18 ENCOUNTER — Encounter: Payer: Self-pay | Admitting: Family Medicine

## 2023-06-18 ENCOUNTER — Ambulatory Visit (INDEPENDENT_AMBULATORY_CARE_PROVIDER_SITE_OTHER): Payer: 59 | Admitting: Family Medicine

## 2023-06-18 VITALS — BP 136/78 | HR 77 | Temp 98.2°F | Resp 18 | Ht 75.0 in | Wt 207.8 lb

## 2023-06-18 DIAGNOSIS — U071 COVID-19: Secondary | ICD-10-CM | POA: Insufficient documentation

## 2023-06-18 DIAGNOSIS — E1165 Type 2 diabetes mellitus with hyperglycemia: Secondary | ICD-10-CM

## 2023-06-18 DIAGNOSIS — R079 Chest pain, unspecified: Secondary | ICD-10-CM

## 2023-06-18 DIAGNOSIS — I10 Essential (primary) hypertension: Secondary | ICD-10-CM

## 2023-06-18 DIAGNOSIS — Z7984 Long term (current) use of oral hypoglycemic drugs: Secondary | ICD-10-CM

## 2023-06-18 DIAGNOSIS — Z7985 Long-term (current) use of injectable non-insulin antidiabetic drugs: Secondary | ICD-10-CM

## 2023-06-18 DIAGNOSIS — E118 Type 2 diabetes mellitus with unspecified complications: Secondary | ICD-10-CM | POA: Diagnosis not present

## 2023-06-18 LAB — POCT GLYCOSYLATED HEMOGLOBIN (HGB A1C): Hemoglobin A1C: 6.6 % — AB (ref 4.0–5.6)

## 2023-06-18 MED ORDER — METFORMIN HCL ER 500 MG PO TB24: 1000.0000 mg | ORAL_TABLET | Freq: Every day | ORAL | 1 refills | Status: DC

## 2023-06-18 NOTE — Patient Instructions (Signed)
Nice to see you. We are going to reduce your metformin to 1000 mg daily.  If your blood sugar continues to drop low we may need to consider discontinuing the [facility].

## 2023-06-18 NOTE — Assessment & Plan Note (Signed)
Chronic issue.  Slightly elevated today.  Seems to have been adequately controlled at home when he does check.  He will continue amlodipine 5 mg daily and losartan 100 mg daily.  He will let us know if his blood pressure is consistently above 130/80.

## 2023-06-18 NOTE — Assessment & Plan Note (Signed)
Symptomatically doing well.  He will wear a mask for another 2 days.

## 2023-06-18 NOTE — Assessment & Plan Note (Addendum)
Chronic issue.  A1c is now controlled.  Hypoglycemia likely related to medications.  Given his low sugars we will try reducing his metformin XR to 1000 mg daily.  He can continue the Mounjaro 2.5 mg weekly.  If he continues to have low sugars we may need to discontinue the [facility].

## 2023-06-18 NOTE — Assessment & Plan Note (Signed)
Seems to be musculoskeletal given his history.  Patient is able to exercise with no chest pain.  Suspect muscular strain.  If it persist he will let us know.  If he develops new symptoms he will seek medical attention.

## 2023-06-18 NOTE — Telephone Encounter (Signed)
Noted

## 2023-06-18 NOTE — Progress Notes (Signed)
Marikay Alar, MD Phone: [phone]  Phillip Norman is a 53 y.o. male who presents today for follow-up.  COVID team: Patient notes onset of symptoms 8 days ago.  He feels pretty much back to normal although he has some runny nose.  Did have some cough and congestion.  No shortness of breath.  Diabetes: Patient notes typical blood sugar ranges are 112-153.  He has had some lows intermittently for a few months.  He has been on Mounjaro and metformin.  No polyuria or polydipsia.  Chest discomfort: Patient notes on a few occasions over the last week he has had a little discomfort in his left lower chest.  Describes it as a tightening on the outside of his chest when he is just sitting there.  No shortness of breath or diaphoresis with this.  Notes one of the times it seemed to occur when his sugar was low and resolved after getting something to eat.  Notes he exercises pretty intensely every other day for about an hour and a half and has no chest pain when he does that.  Hypertension: Patient reports blood pressures at home typically upper 120s over upper 70s.   Social History   Tobacco Use  Smoking Status Never  Smokeless Tobacco Never    Current Outpatient Medications on File Prior to Visit  Medication Sig Dispense Refill   amLODipine (NORVASC) 5 MG tablet Take 1 tablet (5 mg total) by mouth daily. 90 tablet 1   atorvastatin (LIPITOR) 10 MG tablet Take 1 tablet (10 mg total) by mouth daily. 90 tablet 1   Continuous Blood Gluc Sensor (FREESTYLE LIBRE 3 SENSOR) MISC 1 EACH BY DOES NOT APPLY ROUTE DAILY AT 2 PM. PLACE 1 SENSOR ON THE SKIN EVERY 14 DAYS. USE TO CHECK GLUCOSE CONTINUOUSLY 2 each 11   fluticasone (FLONASE) 50 MCG/ACT nasal spray Place 2 sprays into both nostrils daily. 16 g 6   losartan (COZAAR) 100 MG tablet Take 1 tablet (100 mg total) by mouth daily. 90 tablet 1   sildenafil (VIAGRA) 50 MG tablet TAKE 1 TABLET BY MOUTH DAILY AS NEEDED FOR ERECTILE DYSFUNCTION *INSUR MAX 24  PER 90 DAYS 14 tablet 2   tirzepatide (MOUNJARO) 2.5 MG/0.5ML Pen Inject 2.5 mg into the skin once a week. 2 mL 2   No current facility-administered medications on file prior to visit.     ROS see history of present illness  Objective  Physical Exam Vitals:   06/18/23 1329 06/18/23 1353  BP: 132/78 136/78  Pulse: 77   Resp: 18   Temp: 98.2 F (36.8 C)   SpO2: 100%     BP Readings from Last 3 Encounters:  06/18/23 136/78  03/09/23 126/78  12/07/22 128/82   Wt Readings from Last 3 Encounters:  06/18/23 207 lb 12 oz (94.2 kg)  03/09/23 216 lb 6.4 oz (98.2 kg)  12/07/22 221 lb 3.2 oz (100.3 kg)    Physical Exam Constitutional:      General: He is not in acute distress.    Appearance: He is not diaphoretic.  Cardiovascular:     Rate and Rhythm: Normal rate and regular rhythm.     Heart sounds: Normal heart sounds.  Pulmonary:     Effort: Pulmonary effort is normal.     Breath sounds: Normal breath sounds.  Chest:     Chest wall: No tenderness.  Skin:    General: Skin is warm and dry.  Neurological:     Mental Status: He is  alert.      Assessment/Plan: Please see individual problem list.  Uncontrolled type 2 diabetes mellitus with hyperglycemia (HCC) Assessment & Plan: Chronic issue.  A1c is now controlled.  Hypoglycemia likely related to medications.  Given his low sugars we will try reducing his metformin XR to 1000 mg daily.  He can continue the Mounjaro 2.5 mg weekly.  If he continues to have low sugars we may need to discontinue the [facility].  Orders: -     POCT glycosylated hemoglobin (Hb A1C) -     metFORMIN HCl ER; Take 2 tablets (1,000 mg total) by mouth daily with breakfast.  Dispense: 180 tablet; Refill: 1  Controlled type 2 diabetes mellitus with complication, without long-term current use of insulin (HCC) Assessment & Plan: Chronic issue.  A1c is now controlled.  Hypoglycemia likely related to medications.  Given his low sugars we will try  reducing his metformin XR to 1000 mg daily.  He can continue the Mounjaro 2.5 mg weekly.  If he continues to have low sugars we may need to discontinue the [facility]   Chest pain, unspecified type Assessment & Plan: Seems to be musculoskeletal given his history.  Patient is able to exercise with no chest pain.  Suspect muscular strain.  If it persist he will let us know.  If he develops new symptoms he will seek medical attention.   COVID-19 Assessment & Plan: Symptomatically doing well.  He will wear a mask for another 2 days.   Essential hypertension Assessment & Plan: Chronic issue.  Slightly elevated today.  Seems to have been adequately controlled at home when he does check.  He will continue amlodipine 5 mg daily and losartan 100 mg daily.  He will let us know if his blood pressure is consistently above 130/80.     Return in about 3 months (around 09/16/2023) for Diabetes follow-up.   Marikay Alar, MD [facility] Primary Care [facility]

## 2023-06-19 ENCOUNTER — Other Ambulatory Visit: Payer: Self-pay | Admitting: Family Medicine

## 2023-06-19 DIAGNOSIS — I10 Essential (primary) hypertension: Secondary | ICD-10-CM

## 2023-09-13 ENCOUNTER — Other Ambulatory Visit: Payer: Self-pay

## 2023-09-13 DIAGNOSIS — E1165 Type 2 diabetes mellitus with hyperglycemia: Secondary | ICD-10-CM

## 2023-09-13 MED ORDER — FREESTYLE LIBRE 3 SENSOR MISC: 1.0000 | Freq: Every day | 11 refills | Status: DC

## 2023-09-24 ENCOUNTER — Encounter: Payer: 59 | Admitting: Nurse Practitioner

## 2023-12-16 ENCOUNTER — Ambulatory Visit: Payer: Self-pay | Admitting: *Deleted

## 2023-12-16 ENCOUNTER — Telehealth: Payer: Self-pay | Admitting: Internal Medicine

## 2023-12-16 ENCOUNTER — Other Ambulatory Visit: Payer: Self-pay

## 2023-12-16 DIAGNOSIS — E1165 Type 2 diabetes mellitus with hyperglycemia: Secondary | ICD-10-CM

## 2023-12-16 DIAGNOSIS — I10 Essential (primary) hypertension: Secondary | ICD-10-CM

## 2023-12-16 MED ORDER — MOUNJARO 2.5 MG/0.5ML ~~LOC~~ SOAJ: 2.5000 mg | SUBCUTANEOUS | 0 refills | Status: DC

## 2023-12-16 MED ORDER — ATORVASTATIN CALCIUM 10 MG PO TABS: 10.0000 mg | ORAL_TABLET | Freq: Every day | ORAL | 0 refills | Status: DC

## 2023-12-16 MED ORDER — MOUNJARO 2.5 MG/0.5ML ~~LOC~~ SOAJ
2.5000 mg | SUBCUTANEOUS | 0 refills | Status: DC
Start: 2023-12-16 — End: 2023-12-16

## 2023-12-16 MED ORDER — METFORMIN HCL ER 500 MG PO TB24: 1000.0000 mg | ORAL_TABLET | Freq: Every day | ORAL | 0 refills | Status: DC

## 2023-12-16 MED ORDER — AMLODIPINE BESYLATE 5 MG PO TABS
5.0000 mg | ORAL_TABLET | Freq: Every day | ORAL | 0 refills | Status: DC
Start: 2023-12-16 — End: 2024-01-25

## 2023-12-16 MED ORDER — LOSARTAN POTASSIUM 100 MG PO TABS: 100.0000 mg | ORAL_TABLET | Freq: Every day | ORAL | 0 refills | Status: DC

## 2023-12-16 MED ORDER — AMLODIPINE BESYLATE 5 MG PO TABS: 5.0000 mg | ORAL_TABLET | Freq: Every day | ORAL | 0 refills | Status: DC

## 2023-12-16 NOTE — Telephone Encounter (Unsigned)
 Copied from CRM [id number]. Topic: Clinical - Medication Refill >> Dec 16, 2023  9:58 AM Franky GRADE wrote: Medication: losartan  (COZAAR ) 100 MG tablet [[id number]] & metFORMIN  (GLUCOPHAGE -XR) 500 MG 24 hr tablet [[id number]]  Has the patient contacted their pharmacy? Yes (Agent: If no, request that the patient contact the pharmacy for the refill. If patient does not wish to contact the pharmacy document the reason why and proceed with request.) (Agent: If yes, when and what did the pharmacy advise?)  This is the patient's preferred pharmacy:  [id number] [address] [facility] - [address] [place] [address] Phone: [phone] Fax: [phone]    Is this the correct pharmacy for this prescription? Yes If no, delete pharmacy and type the correct one.   Has the prescription been filled recently? No  Is the patient out of the medication? Yes  Has the patient been seen for an appointment in the last year OR does the patient have an upcoming appointment? Yes  Can we respond through MyChart? Yes  Agent: Please be advised that Rx refills may take up to 3 business days. We ask that you follow-up with your pharmacy.

## 2023-12-16 NOTE — Telephone Encounter (Signed)
 Reason for Disposition  [1] Caller has URGENT medicine question about med that primary care doctor (or NP/PA) or specialist prescribed AND [2] triager unable to answer question  Answer Assessment - Initial Assessment Questions 1. NAME of MEDICINE: What medicine(s) are you calling about?     I called yesterday about a refill.   I need to see a doctor before I can get a refill.   It's my BP medicine.   I don't have any metformin .   My appt is not until Monday.   Can I get some medicine to last me until Monday?   Dr. Maribeth was my doctor.   The earliest appt they can get me is in Oct.    I'm seeing another doctor on Monday for extending my prescriptions.  2. QUESTION: What is your question? (e.g., double dose of medicine, side effect)     Can I get medicine to get me through until Monday? Metformin  and my BP medicine.  Lorsartan.    3. PRESCRIBER: Who prescribed the medicine? Reason: if prescribed by specialist, call should be referred to that group.     Dr. Maribeth. 4. SYMPTOMS: Do you have any symptoms? If Yes, ask: What symptoms are you having?  How bad are the symptoms (e.g., mild, moderate, severe)     I'm out of both of these. 5. PREGNANCY:  Is there any chance that you are pregnant? When was your last menstrual period?     N/A  Protocols used: Medication Question Call-A-AH FYI Only or Action Required?: Action required by provider: medication refill request.  Patient was last seen in primary care on 06/18/2023 by Maribeth Camellia MATSU, MD.  Called Nurse Triage reporting Medication Problem.  Symptoms began today.  Interventions attempted: Prescription medications: Pt is out of Losartan  100 mg and metformin  500 mg.  He is a former pt of Dr. Maribeth.  He has an appt with Dr. Narendra on Monday 12/20/2023 at 1:00.   He is asking if enough medication can be sent to [facility] on [address] To last him until Monday.     Symptoms are: stable. No symptoms  Triage  Disposition: Call PCP Now  Patient/caregiver understands and will follow disposition?: YesFYI Only or Action Required?:   Patient was last seen in primary care on 06/18/2023 by Maribeth Camellia MATSU, MD.  Called Nurse Triage reporting Medication Problem. See above  Symptoms began today. No symptoms  Interventions attempted: Prescription medications: See above.  Symptoms are: stable  No symptoms.  Triage Disposition: Call PCP Now  Patient/caregiver understands and will follow disposition?: Yes He is scheduled to see Vincente in Oct. 2025.

## 2023-12-16 NOTE — Telephone Encounter (Signed)
 Meds refill for 30 days. Pt notified and informed to keep scheduled appt. Pt verbalized understanding

## 2023-12-16 NOTE — Addendum Note (Signed)
 Addended by: Mykai Wendorf on: 12/16/2023 10:49 AM   Modules accepted: Orders

## 2023-12-16 NOTE — Telephone Encounter (Signed)
 This has already been handled please see previous encounter

## 2023-12-20 ENCOUNTER — Encounter: Payer: Self-pay | Admitting: Internal Medicine

## 2023-12-20 ENCOUNTER — Ambulatory Visit (INDEPENDENT_AMBULATORY_CARE_PROVIDER_SITE_OTHER): Admitting: Internal Medicine

## 2023-12-20 VITALS — BP 122/78 | HR 72 | Temp 97.7°F | Ht 75.0 in | Wt 213.2 lb

## 2023-12-20 DIAGNOSIS — E118 Type 2 diabetes mellitus with unspecified complications: Secondary | ICD-10-CM

## 2023-12-20 DIAGNOSIS — I10 Essential (primary) hypertension: Secondary | ICD-10-CM | POA: Diagnosis not present

## 2023-12-20 DIAGNOSIS — E785 Hyperlipidemia, unspecified: Secondary | ICD-10-CM

## 2023-12-20 DIAGNOSIS — Z7984 Long term (current) use of oral hypoglycemic drugs: Secondary | ICD-10-CM

## 2023-12-20 LAB — COMPREHENSIVE METABOLIC PANEL WITH GFR
ALT: 13 U/L (ref 0–53)
AST: 14 U/L (ref 0–37)
Albumin: 4.4 g/dL (ref 3.5–5.2)
Alkaline Phosphatase: 63 U/L (ref 39–117)
BUN: 9 mg/dL (ref 6–23)
CO2: 30 meq/L (ref 19–32)
Calcium: 9.1 mg/dL (ref 8.4–10.5)
Chloride: 102 meq/L (ref 96–112)
Creatinine, Ser: 0.96 mg/dL (ref 0.40–1.50)
GFR: 90.23 mL/min (ref >60.00)
Glucose, Bld: 131 mg/dL — ABNORMAL HIGH (ref 70–99)
Potassium: 3.8 meq/L (ref 3.5–5.1)
Sodium: 140 meq/L (ref 135–145)
Total Bilirubin: 0.4 mg/dL (ref 0.2–1.2)
Total Protein: 6.9 g/dL (ref 6.0–8.3)

## 2023-12-20 LAB — HEMOGLOBIN A1C: Hgb A1c MFr Bld: 8.7 % — ABNORMAL HIGH (ref 4.6–6.5)

## 2023-12-20 LAB — MICROALBUMIN / CREATININE URINE RATIO
Creatinine,U: 172.4 mg/dL
Microalb Creat Ratio: 13.5 mg/g (ref 0.0–30.0)
Microalb, Ur: 2.3 mg/dL — ABNORMAL HIGH (ref 0.0–1.9)

## 2023-12-20 LAB — LIPID PANEL
Cholesterol: 126 mg/dL (ref 0–200)
HDL: 55.7 mg/dL (ref >39.00)
LDL Cholesterol: 57 mg/dL (ref 0–99)
NonHDL: 70.4
Total CHOL/HDL Ratio: 2
Triglycerides: 65 mg/dL (ref 0.0–149.0)
VLDL: 13 mg/dL (ref 0.0–40.0)

## 2023-12-20 MED ORDER — METFORMIN HCL ER 500 MG PO TB24
1000.0000 mg | ORAL_TABLET | Freq: Two times a day (BID) | ORAL | 1 refills | Status: AC
Start: 2023-12-20 — End: ?

## 2023-12-20 NOTE — Assessment & Plan Note (Signed)
-   This problem is chronic and stable -Last LDL was 65 (at goal) -Will continue with atorvastatin  10 mg daily. -Will recheck his lipid panel today -No further workup at this time

## 2023-12-20 NOTE — Progress Notes (Signed)
 Established Patient Office Visit  Subjective   Patient ID: Phillip Norman, male    DOB: Jan 10, 1971  Age: 53 y.o. MRN: [id number]  Chief Complaint  Patient presents with   Medical Management of Chronic Issues    HPI  Patient is here for routine follow-up of his hypertension, hyperlipidemia and diabetes.  Patient's last A1c was in January and was at goal (6.6).  At that time he was having some lows as well with blood sugars in the 50s.  He was instructed to decrease his metformin  to 1000 mg once daily and continue with Mounjaro  2.5 mg weekly.  Patient states that he has stopped the Mounjaro  (because of the cost) and is taking the metformin  1000 mg twice daily.  He does state that if he does need an additional medication he would not like Jardiance  and would prefer Ozempic or Mounjaro .  He has noted some blood sugars in the 300s especially when he is drinking tea or Kool-Aid.  Patient is compliant with his blood pressure medications and is on amlodipine  5 mg daily as well as losartan  100 mg daily.    Patient has also been taking his Lipitor as directed.  His last LDL was 65.  He had run out of the medication for a couple of days but states that he has been taking it since he got his refill.  Review of Systems  Constitutional: Negative.   HENT: Negative.    Respiratory: Negative.    Cardiovascular: Negative.   Gastrointestinal: Negative.   Genitourinary: Negative.   Neurological: Negative.   Psychiatric/Behavioral: Negative.        Objective:     BP 122/78   Pulse 72   Temp 97.7 F (36.5 C)   Ht 6' 3 (1.905 m)   Wt 213 lb 3.2 oz (96.7 kg)   SpO2 96%   BMI 26.65 kg/m     Physical Exam Constitutional:      Appearance: Normal appearance.  HENT:     Head: Normocephalic and atraumatic.  Cardiovascular:     Rate and Rhythm: Normal rate and regular rhythm.     Heart sounds: Normal heart sounds.  Pulmonary:     Effort: No respiratory distress.     Breath sounds: Normal  breath sounds. No wheezing or rales.  Musculoskeletal:        General: No swelling or tenderness.  Neurological:     General: No focal deficit present.     Mental Status: He is alert and oriented to person, place, and time.  Psychiatric:        Mood and Affect: Mood normal.        Behavior: Behavior normal.      No results found for any visits on 12/20/23.     The 10-year ASCVD risk score (Arnett DK, et al., 2019) is: 16.1%* (Cholesterol units were assumed)    Assessment & Plan:   Problem List Items Addressed This Visit       Cardiovascular and Mediastinum   Essential hypertension - Primary (Chronic)   - This problem is chronic and stable -Patient is compliant with losartan  100 mg daily and amlodipine  5 mg daily -Blood pressure today is well-controlled at 122/78 -Will continue with current medication regimen for now -Will check CMP -No further workup at this time      Relevant Orders   Comprehensive metabolic panel with GFR     Endocrine   Controlled diabetes mellitus type 2 with complications (HCC)   -  Patient has a history of diabetes associated with hyperlipidemia and hypertension -Last A1c was at goal at 6.6.  At that time, patient was experiencing some lows in the 50s and was instructed to decrease metformin  to 1000 mg once daily and continue with Mounjaro  2.5 mg weekly.  However, the cost of Mounjaro  was prohibitive and patient stopped the Mounjaro  instead and continued with metformin  1000 g twice daily -He has noted elevated blood sugars up to the 300s on occasion especially when he is drinking tea or Kool-Aid -Will check a repeat A1c today.  If it is elevated would consider starting the patient on Mounjaro  or Ozempic (depending on which one is covered by his insurance).  Patient would not like to be on an SGLT2 inhibitor (Jardiance ) secondary to side effects that he read about -Will check a CMP, UACR and lipid panel today -No further workup at this time       Relevant Medications   metFORMIN  (GLUCOPHAGE -XR) 500 MG 24 hr tablet   Other Relevant Orders   Comprehensive metabolic panel with GFR   Hemoglobin A1c   Microalbumin / creatinine urine ratio     Other   Hyperlipidemia LDL goal <70 (Chronic)   - This problem is chronic and stable -Last LDL was 65 (at goal) -Will continue with atorvastatin  10 mg daily. -Will recheck his lipid panel today -No further workup at this time      Relevant Orders   Lipid panel    No follow-ups on file.    Maribelle Hopple, MD

## 2023-12-20 NOTE — Assessment & Plan Note (Signed)
-   Patient has a history of diabetes associated with hyperlipidemia and hypertension -Last A1c was at goal at 6.6.  At that time, patient was experiencing some lows in the 50s and was instructed to decrease metformin  to 1000 mg once daily and continue with Mounjaro  2.5 mg weekly.  However, the cost of Mounjaro  was prohibitive and patient stopped the Mounjaro  instead and continued with metformin  1000 g twice daily -He has noted elevated blood sugars up to the 300s on occasion especially when he is drinking tea or Kool-Aid -Will check a repeat A1c today.  If it is elevated would consider starting the patient on Mounjaro  or Ozempic (depending on which one is covered by his insurance).  Patient would not like to be on an SGLT2 inhibitor (Jardiance ) secondary to side effects that he read about -Will check a CMP, UACR and lipid panel today -No further workup at this time

## 2023-12-20 NOTE — Assessment & Plan Note (Signed)
-   This problem is chronic and stable -Patient is compliant with losartan  100 mg daily and amlodipine  5 mg daily -Blood pressure today is well-controlled at 122/78 -Will continue with current medication regimen for now -Will check CMP -No further workup at this time

## 2023-12-20 NOTE — Patient Instructions (Signed)
-   It was a pleasure meeting you today -We will check some blood work on you today including your A1c, liver function, kidney function, cholesterol as well as a urine test to check for protein in your urine -I suspect that your A1c is likely going to come back higher than it has been based on your freestyle libre readings.  If it is not at goal we will start you on a medication to help in addition to the metformin  -Please continue to watch your diet and continue to exercise -Please contact us  with any questions or concerns or if you need any refills

## 2023-12-21 ENCOUNTER — Ambulatory Visit: Payer: Self-pay | Admitting: Internal Medicine

## 2023-12-21 ENCOUNTER — Other Ambulatory Visit: Payer: Self-pay | Admitting: Pharmacist

## 2023-12-21 DIAGNOSIS — E118 Type 2 diabetes mellitus with unspecified complications: Secondary | ICD-10-CM

## 2023-12-21 DIAGNOSIS — E1165 Type 2 diabetes mellitus with hyperglycemia: Secondary | ICD-10-CM

## 2023-12-21 MED ORDER — MOUNJARO 2.5 MG/0.5ML ~~LOC~~ SOAJ: 2.5000 mg | SUBCUTANEOUS | 0 refills | Status: DC

## 2023-12-21 NOTE — Progress Notes (Signed)
 Brief Telephone Documentation Reason for Call: Medication Access  Summary of Call: Per Epic, patient still has coverage through [place] [employer].   Provider would like to start GLP1 medication though patient concerns as he previously stopped taking Mounjaro  due to cost concerns through [employer].   Insurance on file:  [employer] - Choice Plus, Automotive engineer  Preferred GLP1s (Tier 3):  Ozempic, Rybelsus, Mounjaro , Trulicity   Prior Auth for Mounjaro : Result = approval already on file   Called and spoke with patient 12/21/23: He states that at the start of the year, his pharmacy told him Mounjaro  would be ~$1200. He believes this may have been due to a deductible. Most recent, when Mounjaro  was re-prescribed, he notes pharmacy told him it would be $143.   Enrolled patient in Mounjaro  Saving's Card: Mounjaro  Savings Card RXBIN: [id number] PCN: [id number] GRP: [id number] ID: [id number] Expiration Date: 05/24/2024  Called patient's pharmacy and provided them with savings card. Pharmacist confirms new copay will be $25 monthly.    Manuelita FABIENE Kobs, PharmD Clinical Pharmacist [facility] [phone]

## 2023-12-21 NOTE — Patient Instructions (Addendum)
 Mr. Zannie Runkle,   It was a pleasure to speak with you today! As we discussed:?   You have been enrolled in the Mounjaro  savings card. This card should continue to reduce your monthly cost to $25 foMounjaro  through the remainder of the year.  The card has been provided to your pharmacy already and they will store the information on your profile there.  The pharmacist did state they do not have the Mounjaro  and will have to order it for you.   Please let us  know if any issues with the Mounjaro  prescription on future refills.  Also, if your pharmacy has a hard time getting the Mounjaro  due to backorder, let us  know. Often [facility] pharmacies have a better supply.   Your Mounjaro  Savings Card RXBIN: [id number] PCNBETHA KALATA GRP: [id number] ID: [id number] Expiration Date: 05/24/2024  Please reach out prior to your next scheduled appointment should you have any questions or concerns.  You may respond directly to this message, or leave me a voicemail at [phone] and I will get back to you shortly.   Thank you!   Future Appointments  Date Time Provider Department Center  02/24/2024  9:00 AM Vincente Saber, NP [facility]   Manuelita FABIENE Kobs, PharmD Clinical Pharmacist [facility]  [phone]

## 2023-12-22 ENCOUNTER — Other Ambulatory Visit: Payer: Self-pay | Admitting: Nurse Practitioner

## 2023-12-22 DIAGNOSIS — E1165 Type 2 diabetes mellitus with hyperglycemia: Secondary | ICD-10-CM

## 2023-12-24 NOTE — Telephone Encounter (Signed)
 Dr. Narendra the company has replaced the Delta 3 with the Burton 3 Plus.  I've updated the prescription for your signature.

## 2024-01-07 ENCOUNTER — Other Ambulatory Visit: Payer: Self-pay | Admitting: Internal Medicine

## 2024-01-07 DIAGNOSIS — I10 Essential (primary) hypertension: Secondary | ICD-10-CM

## 2024-01-25 ENCOUNTER — Ambulatory Visit (INDEPENDENT_AMBULATORY_CARE_PROVIDER_SITE_OTHER): Admitting: Nurse Practitioner

## 2024-01-25 ENCOUNTER — Encounter: Payer: Self-pay | Admitting: Nurse Practitioner

## 2024-01-25 VITALS — BP 128/82 | HR 73 | Temp 97.6°F | Ht 75.0 in | Wt 210.2 lb

## 2024-01-25 DIAGNOSIS — Z7984 Long term (current) use of oral hypoglycemic drugs: Secondary | ICD-10-CM

## 2024-01-25 DIAGNOSIS — E118 Type 2 diabetes mellitus with unspecified complications: Secondary | ICD-10-CM

## 2024-01-25 DIAGNOSIS — E663 Overweight: Secondary | ICD-10-CM

## 2024-01-25 DIAGNOSIS — I10 Essential (primary) hypertension: Secondary | ICD-10-CM

## 2024-01-25 DIAGNOSIS — Z7985 Long-term (current) use of injectable non-insulin antidiabetic drugs: Secondary | ICD-10-CM

## 2024-01-25 DIAGNOSIS — E1165 Type 2 diabetes mellitus with hyperglycemia: Secondary | ICD-10-CM

## 2024-01-25 MED ORDER — AMLODIPINE BESYLATE 5 MG PO TABS: 5.0000 mg | ORAL_TABLET | Freq: Every day | ORAL | 2 refills | Status: AC

## 2024-01-25 MED ORDER — MOUNJARO 2.5 MG/0.5ML ~~LOC~~ SOAJ: 2.5000 mg | SUBCUTANEOUS | 3 refills | Status: AC

## 2024-01-25 NOTE — Progress Notes (Signed)
 Established Patient Office Visit  Subjective:  Patient ID: Phillip Norman, male    DOB: May 06, 1971  Age: 53 y.o. MRN: [id number]  CC:  Chief Complaint  Patient presents with   Medical Management of Chronic Issues   Discussed the use of a AI scribe software for clinical note transcription with the patient, who gave verbal consent to proceed.  HPI  Phillip Norman presents for follow up on diabetes. He has been on Mounjaro  2.5 mg weekly since early August, achieving improved blood glucose control with 80% of readings between 70 to 180 mg/dL. Previously, glucose levels often exceeded 300 mg/dL. He experienced hypoglycemia with dose increases, with blood sugar dropping below 50 mg/dL, and prefers to maintain the 2.5 mg dose. The lowest recent blood sugar level was 69 mg/dL.  He is also on metformin  2000 mg daily. He uses a Libre sensor for glucose monitoring, which recently expired, and plans to obtain a new one.  He consumes fewer carbohydrates and exercises at the gym twice a week for about an hour and fifteen minutes, focusing on cardio and muscle training. Occasionally, he increases gym visits to three times a week.  He would also like to get refill for amlodipine .   No shortness of breath or chest pain reported recently. HPI   Past Medical History:  Diagnosis Date   Chronic back pain    Diabetes mellitus without complication (HCC)    GERD (gastroesophageal reflux disease)    Hyperlipidemia    Hypertension     Past Surgical History:  Procedure Laterality Date   COLONOSCOPY WITH PROPOFOL  N/A 11/10/2019   Procedure: COLONOSCOPY WITH PROPOFOL ;  Surgeon: Therisa Bi, MD;  Location: [facility] ENDOSCOPY;  Service: Gastroenterology;  Laterality: N/A;   TONSILLECTOMY AND ADENOIDECTOMY      Family History  Problem Relation Age of Onset   Hypertension Mother    Diabetes Father    Breast cancer Paternal Aunt    Diabetes Paternal Aunt    Prostate cancer Paternal Uncle     Social History    Socioeconomic History   Marital status: Married    Spouse name: Not on file   Number of children: 1   Years of education: Not on file   Highest education level: Master's degree (e.g., MA, MS, MEng, MEd, MSW, MBA)  Occupational History   Occupation: SENIOR FINANCE MANAGER  Tobacco Use   Smoking status: Never   Smokeless tobacco: Never  Vaping Use   Vaping status: Never Used  Substance and Sexual Activity   Alcohol use: No   Drug use: No   Sexual activity: Not on file  Other Topics Concern   Not on file  Social History Narrative   REGULAR EXERCISE-NO   MASTER LEVEL EDUCATION   Social Drivers of Health   Financial Resource Strain: Low Risk  (12/16/2023)   Overall Financial Resource Strain (CARDIA)    Difficulty of Paying Living Expenses: Not hard at all  Food Insecurity: No Food Insecurity (12/16/2023)   Hunger Vital Sign    Worried About Running Out of Food in the Last Year: Never true    Ran Out of Food in the Last Year: Never true  Transportation Needs: No Transportation Needs (12/16/2023)   PRAPARE - Administrator, Civil Service (Medical): No    Lack of Transportation (Non-Medical): No  Physical Activity: Sufficiently Active (12/16/2023)   Exercise Vital Sign    Days of Exercise per Week: 2 days    Minutes of Exercise per  Session: 80 min  Stress: No Stress Concern Present (12/16/2023)   [employer] of Occupational Health - Occupational Stress Questionnaire    Feeling of Stress: Only a little  Social Connections: Moderately Integrated (12/16/2023)   Social Connection and Isolation Panel    Frequency of Communication with Friends and Family: Once a week    Frequency of Social Gatherings with Friends and Family: Never    Attends Religious Services: More than 4 times per year    Active Member of [employer] or Organizations: Yes    Attends Engineer, structural: More than 4 times per year    Marital Status: Married  Catering manager Violence: Not on  file     Outpatient Medications Prior to Visit  Medication Sig Dispense Refill   atorvastatin  (LIPITOR) 10 MG tablet TAKE 1 TABLET BY MOUTH EVERY DAY 90 tablet 1   Continuous Glucose Sensor (FREESTYLE LIBRE 3 PLUS SENSOR) MISC Place 1 sensor on the skin every 15 days. Use to check glucose continuously. 2 each 5   fluticasone  (FLONASE ) 50 MCG/ACT nasal spray Place 2 sprays into both nostrils daily. 16 g 6   losartan  (COZAAR ) 100 MG tablet TAKE 1 TABLET BY MOUTH EVERY DAY 90 tablet 1   metFORMIN  (GLUCOPHAGE -XR) 500 MG 24 hr tablet Take 2 tablets (1,000 mg total) by mouth 2 (two) times daily with a meal. 180 tablet 1   sildenafil  (VIAGRA ) 50 MG tablet TAKE 1 TABLET BY MOUTH DAILY AS NEEDED FOR ERECTILE DYSFUNCTION *INSUR MAX 24 PER 90 DAYS 14 tablet 2   amLODipine  (NORVASC ) 5 MG tablet Take 1 tablet (5 mg total) by mouth daily. 30 tablet 0   tirzepatide  (MOUNJARO ) 2.5 MG/0.5ML Pen Inject 2.5 mg into the skin once a week. 2 mL 0   No facility-administered medications prior to visit.    No Known Allergies  ROS Review of Systems Negative unless indicated in HPI.    Objective:    Physical Exam Constitutional:      Appearance: Normal appearance.  HENT:     Mouth/Throat:     Mouth: Mucous membranes are moist.  Eyes:     Conjunctiva/sclera: Conjunctivae normal.     Pupils: Pupils are equal, round, and reactive to light.  Cardiovascular:     Rate and Rhythm: Normal rate and regular rhythm.     Pulses: Normal pulses.     Heart sounds: Normal heart sounds.  Pulmonary:     Effort: Pulmonary effort is normal.     Breath sounds: Normal breath sounds.  Abdominal:     General: Bowel sounds are normal.     Palpations: Abdomen is soft.  Musculoskeletal:     Cervical back: Normal range of motion. No tenderness.  Skin:    General: Skin is warm.     Findings: No bruising.  Neurological:     General: No focal deficit present.     Mental Status: He is alert and oriented to person, place,  and time. Mental status is at baseline.  Psychiatric:        Mood and Affect: Mood normal.        Behavior: Behavior normal.        Thought Content: Thought content normal.        Judgment: Judgment normal.     BP 128/82   Pulse 73   Temp 97.6 F (36.4 C)   Ht 6' 3 (1.905 m)   Wt 210 lb 3.2 oz (95.3 kg)   SpO2 98%  BMI 26.27 kg/m  Wt Readings from Last 3 Encounters:  01/25/24 210 lb 3.2 oz (95.3 kg)  12/20/23 213 lb 3.2 oz (96.7 kg)  06/18/23 207 lb 12 oz (94.2 kg)     Health Maintenance  Topic Date Due   Hepatitis C Screening  Never done   Hepatitis B Vaccines 19-59 Average Risk (1 of 3 - 19+ 3-dose series) Never done   OPHTHALMOLOGY EXAM  02/15/2019   Pneumococcal Vaccine: 50+ Years (3 of 3 - PCV20 or PCV21) 06/07/2020   Zoster Vaccines- Shingrix (1 of 2) Never done   FOOT EXAM  12/07/2023   COVID-19 Vaccine (3 - 2025-26 season) 01/24/2024   DTaP/Tdap/Td (2 - Td or Tdap) 02/09/2024   Influenza Vaccine  08/22/2024 (Originally 12/24/2023)   HEMOGLOBIN A1C  06/21/2024   Diabetic kidney evaluation - eGFR measurement  12/19/2024   Diabetic kidney evaluation - Urine ACR  12/19/2024   Colonoscopy  11/09/2029   HIV Screening  Completed   HPV VACCINES  Aged Out   Meningococcal B Vaccine  Aged Out       Topic Date Due   Hepatitis B Vaccines 19-59 Average Risk (1 of 3 - 19+ 3-dose series) Never done    Lab Results  Component Value Date   TSH 2.663 07/27/2019   Lab Results  Component Value Date   WBC 5.3 11/11/2022   HGB 12.1 (A) 11/11/2022   HCT 38 (A) 11/11/2022   MCV 85.6 09/26/2019   PLT 266 09/26/2019   Lab Results  Component Value Date   NA 140 12/20/2023   K 3.8 12/20/2023   CO2 30 12/20/2023   GLUCOSE 131 (H) 12/20/2023   BUN 9 12/20/2023   CREATININE 0.96 12/20/2023   BILITOT 0.4 12/20/2023   ALKPHOS 63 12/20/2023   AST 14 12/20/2023   ALT 13 12/20/2023   PROT 6.9 12/20/2023   ALBUMIN 4.4 12/20/2023   CALCIUM  9.1 12/20/2023   ANIONGAP 9  09/26/2019   EGFR 101 11/11/2022   GFR 90.23 12/20/2023   Lab Results  Component Value Date   CHOL 126 12/20/2023   Lab Results  Component Value Date   HDL 55.70 12/20/2023   Lab Results  Component Value Date   LDLCALC 57 12/20/2023   Lab Results  Component Value Date   TRIG 65.0 12/20/2023   Lab Results  Component Value Date   CHOLHDL 2 12/20/2023   Lab Results  Component Value Date   HGBA1C 8.7 (H) 12/20/2023      Assessment & Plan:  Overweight  Uncontrolled type 2 diabetes mellitus with hyperglycemia (HCC) -     Mounjaro ; Inject 2.5 mg into the skin once a week.  Dispense: 2 mL; Refill: 3  Essential hypertension Assessment & Plan: Chronic stable. -Patient is compliant with losartan  100 mg daily and amlodipine  5 mg daily -Blood pressure today is well-controlled at 122/78 -Will continue with current medication regimen for now   Orders: -     amLODIPine  Besylate; Take 1 tablet (5 mg total) by mouth daily.  Dispense: 90 tablet; Refill: 2  Controlled type 2 diabetes mellitus with complication, without long-term current use of insulin (HCC) Assessment & Plan: Managed with Mounjaro  and metformin . Blood glucose improved with 80% readings in target range. Hypoglycemia occurred with increased Mounjaro  dose, so current dose maintained. Engaging in lifestyle modifications. - Continue Mounjaro  2.5 mg weekly. - Continue metformin  2000 mg daily.     Follow-up: Return for toc in october.   Maryella Abood  Vincente, NP

## 2024-02-15 NOTE — Assessment & Plan Note (Signed)
 Chronic stable. -Patient is compliant with losartan  100 mg daily and amlodipine  5 mg daily -Blood pressure today is well-controlled at 122/78 -Will continue with current medication regimen for now

## 2024-02-15 NOTE — Assessment & Plan Note (Signed)
 Managed with Mounjaro  and metformin . Blood glucose improved with 80% readings in target range. Hypoglycemia occurred with increased Mounjaro  dose, so current dose maintained. Engaging in lifestyle modifications. - Continue Mounjaro  2.5 mg weekly. - Continue metformin  2000 mg daily.

## 2024-02-24 ENCOUNTER — Encounter: Payer: Self-pay | Admitting: Nurse Practitioner

## 2024-02-24 ENCOUNTER — Ambulatory Visit: Admitting: Nurse Practitioner

## 2024-02-24 VITALS — BP 124/78 | HR 72 | Temp 97.3°F | Ht 75.0 in | Wt 212.6 lb

## 2024-02-24 DIAGNOSIS — E785 Hyperlipidemia, unspecified: Secondary | ICD-10-CM

## 2024-02-24 DIAGNOSIS — E118 Type 2 diabetes mellitus with unspecified complications: Secondary | ICD-10-CM

## 2024-02-24 DIAGNOSIS — I10 Essential (primary) hypertension: Secondary | ICD-10-CM

## 2024-02-24 DIAGNOSIS — Z23 Encounter for immunization: Secondary | ICD-10-CM | POA: Diagnosis not present

## 2024-02-24 DIAGNOSIS — Z7985 Long-term (current) use of injectable non-insulin antidiabetic drugs: Secondary | ICD-10-CM | POA: Diagnosis not present

## 2024-02-24 NOTE — Progress Notes (Signed)
 Established Patient Office Visit  Subjective:  Patient ID: Phillip Norman, male    DOB: 11/27/1970  Age: 53 y.o. MRN: [id number]  CC:  Chief Complaint  Patient presents with   Establish Care    Transfer of Care & 3 month follow up   Discussed the use of a AI scribe software for clinical note transcription with the patient, who gave verbal consent to proceed.  HPI  Phillip Norman presents for transfer of care. His previous PCP was Dr. Maribeth.  Diabetes:  He takes metformin  and Mounjaro  for diabetes. Mounjaro  causes constipation, managed with stool softeners and coffee. He is on the lowest dose of Mounjaro  at 2.5 mg and is due for an A1c check next month. He currently takes two 500 mg metformin  tablets daily, reduced from four tablets. He experienced a low blood sugar reading but has had no lows in the past week and only two in the last two weeks. He uses a continuous glucose monitor and his current blood sugar is 176 mg/dL after eating crackers and taking medication.  Hyperlipidemia: He has high cholesterol, managed with atorvastatin .  Hypertension: managed with losartan  and amlodipine . His home blood pressure readings are around 130/80 mmHg, with a recent reading of 124/70 mmHg.  Declined shingles and pneumonia vaccines   Past Medical History:  Diagnosis Date   Chronic back pain    Diabetes mellitus without complication (HCC)    GERD (gastroesophageal reflux disease)    Hyperlipidemia    Hypertension     Past Surgical History:  Procedure Laterality Date   COLONOSCOPY WITH PROPOFOL  N/A 11/10/2019   Procedure: COLONOSCOPY WITH PROPOFOL ;  Surgeon: Therisa Bi, MD;  Location: [facility] ENDOSCOPY;  Service: Gastroenterology;  Laterality: N/A;   TONSILLECTOMY AND ADENOIDECTOMY      Family History  Problem Relation Age of Onset   Hypertension Mother    Diabetes Father    Breast cancer Paternal Aunt    Diabetes Paternal Aunt    Prostate cancer Paternal Uncle     Social History    Socioeconomic History   Marital status: Married    Spouse name: Not on file   Number of children: 4   Years of education: Not on file   Highest education level: Master's degree (e.g., MA, MS, MEng, MEd, MSW, MBA)  Occupational History   Occupation: SENIOR FINANCE MANAGER  Tobacco Use   Smoking status: Never   Smokeless tobacco: Never  Vaping Use   Vaping status: Never Used  Substance and Sexual Activity   Alcohol use: No   Drug use: No   Sexual activity: Not on file  Other Topics Concern   Not on file  Social History Narrative   REGULAR EXERCISE-NO   MASTER LEVEL EDUCATION   Social Drivers of Health   Financial Resource Strain: Low Risk  (12/16/2023)   Overall Financial Resource Strain (CARDIA)    Difficulty of Paying Living Expenses: Not hard at all  Food Insecurity: No Food Insecurity (12/16/2023)   Hunger Vital Sign    Worried About Running Out of Food in the Last Year: Never true    Ran Out of Food in the Last Year: Never true  Transportation Needs: No Transportation Needs (12/16/2023)   PRAPARE - Administrator, Civil Service (Medical): No    Lack of Transportation (Non-Medical): No  Physical Activity: Sufficiently Active (12/16/2023)   Exercise Vital Sign    Days of Exercise per Week: 2 days    Minutes of Exercise per  Session: 80 min  Stress: No Stress Concern Present (12/16/2023)   [employer] of Occupational Health - Occupational Stress Questionnaire    Feeling of Stress: Only a little  Social Connections: Moderately Integrated (12/16/2023)   Social Connection and Isolation Panel    Frequency of Communication with Friends and Family: Once a week    Frequency of Social Gatherings with Friends and Family: Never    Attends Religious Services: More than 4 times per year    Active Member of [employer] or Organizations: Yes    Attends Engineer, structural: More than 4 times per year    Marital Status: Married  Catering manager Violence: Not on  file     Outpatient Medications Prior to Visit  Medication Sig Dispense Refill   amLODipine  (NORVASC ) 5 MG tablet Take 1 tablet (5 mg total) by mouth daily. 90 tablet 2   atorvastatin  (LIPITOR) 10 MG tablet TAKE 1 TABLET BY MOUTH EVERY DAY 90 tablet 1   Continuous Glucose Sensor (FREESTYLE LIBRE 3 PLUS SENSOR) MISC Place 1 sensor on the skin every 15 days. Use to check glucose continuously. 2 each 5   fluticasone  (FLONASE ) 50 MCG/ACT nasal spray Place 2 sprays into both nostrils daily. 16 g 6   losartan  (COZAAR ) 100 MG tablet TAKE 1 TABLET BY MOUTH EVERY DAY 90 tablet 1   metFORMIN  (GLUCOPHAGE -XR) 500 MG 24 hr tablet Take 2 tablets (1,000 mg total) by mouth 2 (two) times daily with a meal. 180 tablet 1   sildenafil  (VIAGRA ) 50 MG tablet TAKE 1 TABLET BY MOUTH DAILY AS NEEDED FOR ERECTILE DYSFUNCTION *INSUR MAX 24 PER 90 DAYS 14 tablet 2   tirzepatide  (MOUNJARO ) 2.5 MG/0.5ML Pen Inject 2.5 mg into the skin once a week. 2 mL 3   No facility-administered medications prior to visit.    No Known Allergies  ROS Review of Systems Negative unless indicated in HPI.    Objective:    Physical Exam Constitutional:      Appearance: Normal appearance.  HENT:     Mouth/Throat:     Mouth: Mucous membranes are moist.  Eyes:     Conjunctiva/sclera: Conjunctivae normal.     Pupils: Pupils are equal, round, and reactive to light.  Cardiovascular:     Rate and Rhythm: Normal rate and regular rhythm.     Pulses: Normal pulses.     Heart sounds: Normal heart sounds.  Pulmonary:     Effort: Pulmonary effort is normal.     Breath sounds: Normal breath sounds.  Musculoskeletal:     Cervical back: Normal range of motion. No tenderness.  Skin:    General: Skin is warm.     Findings: No bruising.  Neurological:     General: No focal deficit present.     Mental Status: He is alert and oriented to person, place, and time. Mental status is at baseline.  Psychiatric:        Mood and Affect: Mood  normal.        Behavior: Behavior normal.        Thought Content: Thought content normal.        Judgment: Judgment normal.     BP 124/78   Pulse 72   Temp (!) 97.3 F (36.3 C)   Ht 6' 3 (1.905 m)   Wt 212 lb 9.6 oz (96.4 kg)   SpO2 98%   BMI 26.57 kg/m  Wt Readings from Last 3 Encounters:  02/24/24 212 lb 9.6 oz (96.4  kg)  01/25/24 210 lb 3.2 oz (95.3 kg)  12/20/23 213 lb 3.2 oz (96.7 kg)     Health Maintenance  Topic Date Due   Hepatitis C Screening  Never done   Hepatitis B Vaccines 19-59 Average Risk (1 of 3 - 19+ 3-dose series) Never done   OPHTHALMOLOGY EXAM  02/15/2019   Pneumococcal Vaccine: 50+ Years (3 of 3 - PCV20 or PCV21) 06/07/2020   Zoster Vaccines- Shingrix (1 of 2) Never done   FOOT EXAM  12/07/2023   COVID-19 Vaccine (3 - 2025-26 season) 03/10/2024 (Originally 01/24/2024)   Influenza Vaccine  08/22/2024 (Originally 12/24/2023)   HEMOGLOBIN A1C  06/21/2024   Diabetic kidney evaluation - eGFR measurement  12/19/2024   Diabetic kidney evaluation - Urine ACR  12/19/2024   Colonoscopy  11/09/2029   DTaP/Tdap/Td (3 - Td or Tdap) 02/23/2034   HIV Screening  Completed   HPV VACCINES  Aged Out   Meningococcal B Vaccine  Aged Out       Topic Date Due   Hepatitis B Vaccines 19-59 Average Risk (1 of 3 - 19+ 3-dose series) Never done    Lab Results  Component Value Date   TSH 2.663 07/27/2019   Lab Results  Component Value Date   WBC 5.3 11/11/2022   HGB 12.1 (A) 11/11/2022   HCT 38 (A) 11/11/2022   MCV 85.6 09/26/2019   PLT 266 09/26/2019   Lab Results  Component Value Date   NA 140 12/20/2023   K 3.8 12/20/2023   CO2 30 12/20/2023   GLUCOSE 131 (H) 12/20/2023   BUN 9 12/20/2023   CREATININE 0.96 12/20/2023   BILITOT 0.4 12/20/2023   ALKPHOS 63 12/20/2023   AST 14 12/20/2023   ALT 13 12/20/2023   PROT 6.9 12/20/2023   ALBUMIN 4.4 12/20/2023   CALCIUM  9.1 12/20/2023   ANIONGAP 9 09/26/2019   EGFR 101 11/11/2022   GFR 90.23 12/20/2023    Lab Results  Component Value Date   CHOL 126 12/20/2023   Lab Results  Component Value Date   HDL 55.70 12/20/2023   Lab Results  Component Value Date   LDLCALC 57 12/20/2023   Lab Results  Component Value Date   TRIG 65.0 12/20/2023   Lab Results  Component Value Date   CHOLHDL 2 12/20/2023   Lab Results  Component Value Date   HGBA1C 8.7 (H) 12/20/2023      Assessment & Plan:  Need for Tdap vaccination -     Tdap vaccine greater than or equal to 7yo IM  Hyperlipidemia LDL goal <70  Essential hypertension Assessment & Plan: Chronic stable. Vitals:   02/24/24 0858  BP: 124/78  - Continue amlodipine  and losartan     Controlled diabetes mellitus type 2 with complications [facility]) Assessment & Plan: Managed with metformin  and Mounjaro . Intermittent constipation from Mounjaro . Episode if nocturnal hypoglycemia, lowest 53 mg/dL. Recent A1c 6.6%. - Order A1c test in one month. - Will reduce metformin  from 2000 mg 1000 mg daily, split into morning and evening doses. - Continue Mounjaro  at 2.5 mg. - Monitor blood glucose levels with continuous glucose monitor. - Discuss dietary adjustments to prevent hypoglycemia, including eating small amounts in the morning.  Orders: -     Hemoglobin A1c; Future    Follow-up: Return in about 4 months (around 06/26/2024), or A1c in 1 month, for chronic management.   Librado Guandique, NP

## 2024-03-06 NOTE — Assessment & Plan Note (Addendum)
 Managed with metformin  and Mounjaro . Intermittent constipation from Mounjaro . Episode if nocturnal hypoglycemia, lowest 53 mg/dL.  Lab Results  Component Value Date   HGBA1C 8.7 (H) 12/20/2023    - Order A1c test in one month. - Will reduce metformin  from 2000 mg 1000 mg daily, split into morning and evening doses. - Continue Mounjaro  at 2.5 mg. - Monitor blood glucose levels with continuous glucose monitor. - Discuss dietary adjustments to prevent hypoglycemia, including eating small amounts in the morning.

## 2024-03-06 NOTE — Assessment & Plan Note (Signed)
 Chronic stable. Vitals:   02/24/24 0858  BP: 124/78  - Continue amlodipine  and losartan 

## 2024-03-07 ENCOUNTER — Ambulatory Visit: Payer: Self-pay

## 2024-03-07 NOTE — Telephone Encounter (Signed)
 Unable to reach patient for triage x 3 attempts.

## 2024-03-07 NOTE — Telephone Encounter (Signed)
 FYI Only or Action Required?: FYI only for provider.  Patient was last seen in primary care on 02/24/2024 by Vincente Saber, NP.  Called Nurse Triage reporting Cough.  Symptoms began several weeks ago.  Interventions attempted: Nothing.  Symptoms are: unchanged.  Triage Disposition: See PCP When Office is Open (Within 3 Days)  Patient/caregiver understands and will follow disposition?: Yes           Reason for Disposition  Cough has been present for > 3 weeks  Answer Assessment - Initial Assessment Questions 1. ONSET: When did the cough begin?      September 18 2. SEVERITY: How bad is the cough today?      Some days are better than others, but has been consistent 3. SPUTUM: Describe the color of your sputum (e.g., none, dry cough; clear, white, yellow, green)     Yellow/green 4. HEMOPTYSIS: Are you coughing up any blood? If Yes, ask: How much? (e.g., flecks, streaks, tablespoons, etc.)     denies 5. DIFFICULTY BREATHING: Are you having difficulty breathing? If Yes, ask: How bad is it? (e.g., mild, moderate, severe)      denies 6. FEVER: Do you have a fever? If Yes, ask: What is your temperature, how was it measured, and when did it start?     denies 7. CARDIAC HISTORY: Do you have any history of heart disease? (e.g., heart attack, congestive heart failure)      denies 8. LUNG HISTORY: Do you have any history of lung disease?  (e.g., pulmonary embolus, asthma, emphysema)     denies 9. PE RISK FACTORS: Do you have a history of blood clots? (or: recent major surgery, recent prolonged travel, bedridden)     denies 10. OTHER SYMPTOMS: Do you have any other symptoms? (e.g., runny nose, wheezing, chest pain)       Denies. Slight sore throat, occasional CP with coughing. 11. PREGNANCY: Is there any chance you are pregnant? When was your last menstrual period?       N/a 12. TRAVEL: Have you traveled out of the country in the last month?  (e.g., travel history, exposures)       N/a  Protocols used: Cough - Acute Productive-A-AH

## 2024-03-07 NOTE — Telephone Encounter (Signed)
 Copied from CRM [id number]. Topic: Clinical - Red Word Triage >> Mar 07, 2024 10:28 AM Terri MATSU wrote: Red Word that prompted transfer to Nurse Triage: Patient has been coughing up yellow greenish mucous since Sept18 and has a sore throat

## 2024-03-07 NOTE — Telephone Encounter (Signed)
 Left Patient a message to call the office back at [phone].

## 2024-03-07 NOTE — Telephone Encounter (Signed)
 This RN made 2nd attempt to reach patient. Left voicemail with call back number.    Copied from CRM [id number]. Topic: Clinical - Red Word Triage >> Mar 07, 2024 10:28 AM Terri MATSU wrote: Red Word that prompted transfer to Nurse Triage: Patient has been coughing up yellow greenish mucous since Sept18 and has a sore throat

## 2024-03-09 ENCOUNTER — Ambulatory Visit (INDEPENDENT_AMBULATORY_CARE_PROVIDER_SITE_OTHER): Admitting: Nurse Practitioner

## 2024-03-09 ENCOUNTER — Encounter: Payer: Self-pay | Admitting: Nurse Practitioner

## 2024-03-09 VITALS — BP 122/80 | HR 74 | Temp 98.0°F | Ht 75.0 in | Wt 212.6 lb

## 2024-03-09 DIAGNOSIS — J069 Acute upper respiratory infection, unspecified: Secondary | ICD-10-CM | POA: Insufficient documentation

## 2024-03-09 MED ORDER — PREDNISONE 20 MG PO TABS: 20.0000 mg | ORAL_TABLET | Freq: Every day | ORAL | 0 refills | Status: AC

## 2024-03-09 MED ORDER — FLUTICASONE PROPIONATE 50 MCG/ACT NA SUSP
2.0000 | Freq: Every day | NASAL | 6 refills | Status: AC
Start: 2024-03-09 — End: ?

## 2024-03-09 MED ORDER — AMOXICILLIN-POT CLAVULANATE 875-125 MG PO TABS
1.0000 | ORAL_TABLET | Freq: Two times a day (BID) | ORAL | 0 refills | Status: AC
Start: 2024-03-09 — End: ?

## 2024-03-09 NOTE — Patient Instructions (Signed)
 You visited us  today due to a persistent cough that has been ongoing since September, along with congestion, mild sore throat, and increased snoring.  YOUR PLAN:  CHRONIC COUGH WITH PHLEGM AND CONGESTION: Your persistent cough with phlegm and congestion is likely due to a respiratory infection. You also mentioned occasional chest pain and slight difficulty breathing. -Take Augmentin  875 mg twice daily for 10 days. -Take prednisone 20 mg once daily in the morning for 5 days. -Increase your fluid intake. -Continue using plain Mucinex (guaifenesin). -Use an antihistamine as needed.  NASAL CONGESTION AND POSTNASAL DRIP: Your nasal congestion and postnasal drip are contributing to your chronic cough. -Use Flonase  nasal spray twice daily. -Follow the proper nasal spray technique as instructed.

## 2024-03-09 NOTE — Progress Notes (Signed)
 Established Patient Office Visit  Subjective:  Patient ID: Phillip Norman, male    DOB: 02-25-1971  Age: 53 y.o. MRN: [id number]  CC:  Chief Complaint  Patient presents with   Acute Visit    Productive cough, snoring a lot since coughing, feels it in his chest and sore throat with coughing since 02/10/24   Discussed the use of a AI scribe software for clinical note transcription with the patient, who gave verbal consent to proceed.  HPI  Phillip Norman presents for cough for more than 3 weeks.  URI  This is a new problem. The current episode started 1 to 4 weeks ago. The problem has been unchanged. There has been no fever. Associated symptoms include chest pain (with coughing), congestion, coughing, a sore throat and wheezing. Pertinent negatives include no diarrhea, ear pain, headaches, joint pain, plugged ear sensation or sinus pain. Associated symptoms comments: Increased snoring. He has tried decongestant for the symptoms. The treatment provided mild relief.   Declined flu vaccine.  Past Medical History:  Diagnosis Date   Chronic back pain    Diabetes mellitus without complication (HCC)    GERD (gastroesophageal reflux disease)    Hyperlipidemia    Hypertension     Past Surgical History:  Procedure Laterality Date   COLONOSCOPY WITH PROPOFOL  N/A 11/10/2019   Procedure: COLONOSCOPY WITH PROPOFOL ;  Surgeon: Therisa Bi, MD;  Location: [facility] ENDOSCOPY;  Service: Gastroenterology;  Laterality: N/A;   TONSILLECTOMY AND ADENOIDECTOMY      Family History  Problem Relation Age of Onset   Hypertension Mother    Diabetes Father    Breast cancer Paternal Aunt    Diabetes Paternal Aunt    Prostate cancer Paternal Uncle     Social History   Socioeconomic History   Marital status: Married    Spouse name: Not on file   Number of children: 4   Years of education: Not on file   Highest education level: Master's degree (e.g., MA, MS, MEng, MEd, MSW, MBA)  Occupational History    Occupation: SENIOR FINANCE MANAGER  Tobacco Use   Smoking status: Never   Smokeless tobacco: Never  Vaping Use   Vaping status: Never Used  Substance and Sexual Activity   Alcohol use: No   Drug use: No   Sexual activity: Not on file  Other Topics Concern   Not on file  Social History Narrative   REGULAR EXERCISE-NO   MASTER LEVEL EDUCATION   Social Drivers of Health   Financial Resource Strain: Low Risk  (12/16/2023)   Overall Financial Resource Strain (CARDIA)    Difficulty of Paying Living Expenses: Not hard at all  Food Insecurity: No Food Insecurity (12/16/2023)   Hunger Vital Sign    Worried About Running Out of Food in the Last Year: Never true    Ran Out of Food in the Last Year: Never true  Transportation Needs: No Transportation Needs (12/16/2023)   PRAPARE - Administrator, Civil Service (Medical): No    Lack of Transportation (Non-Medical): No  Physical Activity: Sufficiently Active (12/16/2023)   Exercise Vital Sign    Days of Exercise per Week: 2 days    Minutes of Exercise per Session: 80 min  Stress: No Stress Concern Present (12/16/2023)   [employer] of Occupational Health - Occupational Stress Questionnaire    Feeling of Stress: Only a little  Social Connections: Moderately Integrated (12/16/2023)   Social Connection and Isolation Panel    Frequency of  Communication with Friends and Family: Once a week    Frequency of Social Gatherings with Friends and Family: Never    Attends Religious Services: More than 4 times per year    Active Member of [employer] or Organizations: Yes    Attends Engineer, structural: More than 4 times per year    Marital Status: Married  Catering manager Violence: Not on file     Outpatient Medications Prior to Visit  Medication Sig Dispense Refill   amLODipine  (NORVASC ) 5 MG tablet Take 1 tablet (5 mg total) by mouth daily. 90 tablet 2   atorvastatin  (LIPITOR) 10 MG tablet TAKE 1 TABLET BY MOUTH EVERY DAY 90  tablet 1   Continuous Glucose Sensor (FREESTYLE LIBRE 3 PLUS SENSOR) MISC Place 1 sensor on the skin every 15 days. Use to check glucose continuously. 2 each 5   fluticasone  (FLONASE ) 50 MCG/ACT nasal spray Place 2 sprays into both nostrils daily. 16 g 6   losartan  (COZAAR ) 100 MG tablet TAKE 1 TABLET BY MOUTH EVERY DAY 90 tablet 1   metFORMIN  (GLUCOPHAGE -XR) 500 MG 24 hr tablet Take 2 tablets (1,000 mg total) by mouth 2 (two) times daily with a meal. 180 tablet 1   sildenafil  (VIAGRA ) 50 MG tablet TAKE 1 TABLET BY MOUTH DAILY AS NEEDED FOR ERECTILE DYSFUNCTION *INSUR MAX 24 PER 90 DAYS 14 tablet 2   tirzepatide  (MOUNJARO ) 2.5 MG/0.5ML Pen Inject 2.5 mg into the skin once a week. 2 mL 3   No facility-administered medications prior to visit.    No Known Allergies  ROS Review of Systems  HENT:  Positive for congestion and sore throat. Negative for ear pain and sinus pain.   Respiratory:  Positive for cough and wheezing.   Cardiovascular:  Positive for chest pain (with coughing).  Gastrointestinal:  Negative for diarrhea.  Musculoskeletal:  Negative for joint pain.  Neurological:  Negative for headaches.   Negative unless indicated in HPI.    Objective:    Physical Exam Constitutional:      Appearance: Normal appearance.  HENT:     Right Ear: Tympanic membrane normal. Tympanic membrane is not erythematous.     Left Ear: Tympanic membrane normal. Tympanic membrane is not erythematous.     Nose:     Right Turbinates: Swollen.     Left Turbinates: Swollen.     Right Sinus: No maxillary sinus tenderness or frontal sinus tenderness.     Left Sinus: No maxillary sinus tenderness or frontal sinus tenderness.     Mouth/Throat:     Mouth: Mucous membranes are moist.     Pharynx: Oropharynx is clear. Postnasal drip present. No pharyngeal swelling, oropharyngeal exudate or posterior oropharyngeal erythema.     Tonsils: No tonsillar exudate.  Cardiovascular:     Rate and Rhythm: Normal  rate and regular rhythm.  Pulmonary:     Effort: Pulmonary effort is normal.     Breath sounds: Normal breath sounds. No stridor. No wheezing.  Neurological:     General: No focal deficit present.     Mental Status: He is alert and oriented to person, place, and time. Mental status is at baseline.  Psychiatric:        Mood and Affect: Mood normal.        Behavior: Behavior normal.        Thought Content: Thought content normal.        Judgment: Judgment normal.     BP 122/80   Pulse 74  Temp 98 F (36.7 C)   Ht 6' 3 (1.905 m)   Wt 212 lb 9.6 oz (96.4 kg)   SpO2 98%   BMI 26.57 kg/m  Wt Readings from Last 3 Encounters:  03/09/24 212 lb 9.6 oz (96.4 kg)  02/24/24 212 lb 9.6 oz (96.4 kg)  01/25/24 210 lb 3.2 oz (95.3 kg)     Health Maintenance  Topic Date Due   Hepatitis C Screening  Never done   Hepatitis B Vaccines 19-59 Average Risk (1 of 3 - 19+ 3-dose series) Never done   OPHTHALMOLOGY EXAM  02/15/2019   Pneumococcal Vaccine: 50+ Years (3 of 3 - PCV20 or PCV21) 06/07/2020   Zoster Vaccines- Shingrix (1 of 2) Never done   FOOT EXAM  12/07/2023   COVID-19 Vaccine (3 - 2025-26 season) 03/10/2024 (Originally 01/24/2024)   Influenza Vaccine  08/22/2024 (Originally 12/24/2023)   HEMOGLOBIN A1C  06/21/2024   Diabetic kidney evaluation - eGFR measurement  12/19/2024   Diabetic kidney evaluation - Urine ACR  12/19/2024   Colonoscopy  11/09/2029   DTaP/Tdap/Td (3 - Td or Tdap) 02/23/2034   HIV Screening  Completed   HPV VACCINES  Aged Out   Meningococcal B Vaccine  Aged Out       Topic Date Due   Hepatitis B Vaccines 19-59 Average Risk (1 of 3 - 19+ 3-dose series) Never done    Lab Results  Component Value Date   TSH 2.663 07/27/2019   Lab Results  Component Value Date   WBC 5.3 11/11/2022   HGB 12.1 (A) 11/11/2022   HCT 38 (A) 11/11/2022   MCV 85.6 09/26/2019   PLT 266 09/26/2019   Lab Results  Component Value Date   NA 140 12/20/2023   K 3.8 12/20/2023    CO2 30 12/20/2023   GLUCOSE 131 (H) 12/20/2023   BUN 9 12/20/2023   CREATININE 0.96 12/20/2023   BILITOT 0.4 12/20/2023   ALKPHOS 63 12/20/2023   AST 14 12/20/2023   ALT 13 12/20/2023   PROT 6.9 12/20/2023   ALBUMIN 4.4 12/20/2023   CALCIUM  9.1 12/20/2023   ANIONGAP 9 09/26/2019   EGFR 101 11/11/2022   GFR 90.23 12/20/2023   Lab Results  Component Value Date   CHOL 126 12/20/2023   Lab Results  Component Value Date   HDL 55.70 12/20/2023   Lab Results  Component Value Date   LDLCALC 57 12/20/2023   Lab Results  Component Value Date   TRIG 65.0 12/20/2023   Lab Results  Component Value Date   CHOLHDL 2 12/20/2023   Lab Results  Component Value Date   HGBA1C 8.7 (H) 12/20/2023      Assessment & Plan:  URI with cough and congestion Assessment & Plan: Vital signs stable, patient in is no sign of acute distress, nontoxic.  Discussed findings with the patient.  Symptoms present for more tham 3 weeks with no sign of resolution. -Will treat with Augmentin  BIDx10 days , prednisone 20 mg x 5days and flonase  nasal spray -Increase fluid intake, rest. - Advised use of antihistamine for PND and plain Mucinex -They will let us  know if symptoms not improving.    Other orders -     Amoxicillin -Pot Clavulanate; Take 1 tablet by mouth 2 (two) times daily.  Dispense: 20 tablet; Refill: 0 -     predniSONE; Take 1 tablet (20 mg total) by mouth daily with breakfast for 5 days.  Dispense: 5 tablet; Refill: 0 -  Fluticasone  Propionate; Place 2 sprays into both nostrils daily.  Dispense: 16 g; Refill: 6    Follow-up: Return if symptoms worsen or fail to improve.   Elhadj Girton, NP

## 2024-03-09 NOTE — Assessment & Plan Note (Signed)
 Vital signs stable, patient in is no sign of acute distress, nontoxic.  Discussed findings with the patient.  Symptoms present for more tham 3 weeks with no sign of resolution. -Will treat with Augmentin  BIDx10 days , prednisone 20 mg x 5days and flonase  nasal spray -Increase fluid intake, rest. - Advised use of antihistamine for PND and plain Mucinex -They will let us  know if symptoms not improving.

## 2024-03-28 ENCOUNTER — Ambulatory Visit: Payer: Self-pay

## 2024-03-28 NOTE — Telephone Encounter (Signed)
 Called pt but he stated that he is voting and not able to be on phone. He stated that he will call back. Please let pt know when he returns call that he will need to schedule an appt to be reevaluated before anymore antibiotics can be sent in.

## 2024-03-28 NOTE — Telephone Encounter (Signed)
 FYI Only or Action Required?: Action required by provider: Requesting additional abx.  Patient was last seen in primary care on 03/09/2024 by Vincente Saber, NP.  Called Nurse Triage reporting Cough.  Symptoms began about a month ago.  Interventions attempted: Prescription medications: Augmentin , Flonase , prednisone.  Symptoms are: gradually improving.  Triage Disposition: Home Care  Patient/caregiver understands and will follow disposition?: Yes Reason for Disposition  Cough  Answer Assessment - Initial Assessment Questions Seen 10/16 for URI, given Augmentin  BIDx10 days , prednisone 20 mg x 5days and flonase  nasal spray. Patient states notices improvement since taking abx, but cough is still present, affecting sleep. Patient states he is snoring really bad. Requesting more abx be sent to pharmacy on file to finish clearing up symptoms. Please advise.   1. ONSET: When did the cough begin?      Over 4 weeks now  2. SEVERITY: How bad is the cough today?      Notices slight improvement since taking abx and prednisone, not gone completely  3. SPUTUM: Describe the color of your sputum (e.g., none, dry cough; clear, white, yellow, green)     Yellowish-green  5. DIFFICULTY BREATHING: Are you having difficulty breathing? If Yes, ask: How bad is it? (e.g., mild, moderate, severe)      Denies  6. FEVER: Do you have a fever? If Yes, ask: What is your temperature, how was it measured, and when did it start?     Denies  7. CARDIAC HISTORY: Do you have any history of heart disease? (e.g., heart attack, congestive heart failure)      Denies  8. LUNG HISTORY: Do you have any history of lung disease?  (e.g., pulmonary embolus, asthma, emphysema)     Denies  9. OTHER SYMPTOMS: Do you have any other symptoms? (e.g., runny nose, wheezing, chest pain)       Chest pain when coughing, nasal congestion, mild sore throat  Protocols used: Cough - Acute Productive-A-AH

## 2024-03-28 NOTE — Telephone Encounter (Signed)
 1st attempt to contact the patient, no answer, LVM to call back, routing for additional attempts.     Copied from CRM [id number]. Topic: Clinical - Medical Advice >> Mar 28, 2024  1:07 PM Viola F wrote: Reason for CRM: Patient was last seen 03/14/24 - he is still has a cough and it's effecting his sleep. He's been off of the antibiotics for a week and wants to know what to do? Please call [phone] (M)

## 2024-03-30 ENCOUNTER — Other Ambulatory Visit

## 2024-03-31 NOTE — Telephone Encounter (Signed)
 Patient has appointment with Charan.

## 2024-04-06 ENCOUNTER — Ambulatory Visit: Admitting: Nurse Practitioner

## 2024-04-11 ENCOUNTER — Other Ambulatory Visit

## 2024-04-11 DIAGNOSIS — E118 Type 2 diabetes mellitus with unspecified complications: Secondary | ICD-10-CM | POA: Diagnosis not present

## 2024-04-11 LAB — HEMOGLOBIN A1C: Hgb A1c MFr Bld: 7.8 % — ABNORMAL HIGH (ref 4.6–6.5)

## 2024-04-12 ENCOUNTER — Ambulatory Visit: Payer: Self-pay | Admitting: Nurse Practitioner

## 2024-07-06 ENCOUNTER — Ambulatory Visit: Admitting: Nurse Practitioner
